# Patient Record
Sex: Male | Born: 1985 | Race: Black or African American | Hispanic: No | Marital: Married | Smoking: Never smoker
Health system: Southern US, Community
[De-identification: ages and names within clinical notes are randomized; demographics above are authoritative.]

## PROBLEM LIST (undated history)

## (undated) DIAGNOSIS — Z803 Family history of malignant neoplasm of breast: Secondary | ICD-10-CM

## (undated) DIAGNOSIS — Z8481 Family history of carrier of genetic disease: Secondary | ICD-10-CM

## (undated) HISTORY — DX: Family history of carrier of genetic disease: Z84.81

## (undated) HISTORY — DX: Family history of malignant neoplasm of breast: Z80.3

---

## 2012-04-19 ENCOUNTER — Emergency Department (HOSPITAL_COMMUNITY): Payer: BC Managed Care – PPO

## 2012-04-19 ENCOUNTER — Encounter (HOSPITAL_COMMUNITY): Payer: Self-pay | Admitting: Emergency Medicine

## 2012-04-19 ENCOUNTER — Emergency Department (HOSPITAL_COMMUNITY)
Admission: EM | Admit: 2012-04-19 | Discharge: 2012-04-19 | Disposition: A | Discharge status: Home / Self Care | Payer: BC Managed Care – PPO | Attending: Emergency Medicine | Admitting: Emergency Medicine

## 2012-04-19 DIAGNOSIS — R319 Hematuria, unspecified: Secondary | ICD-10-CM | POA: Insufficient documentation

## 2012-04-19 DIAGNOSIS — Z79899 Other long term (current) drug therapy: Secondary | ICD-10-CM | POA: Insufficient documentation

## 2012-04-19 LAB — URINALYSIS, ROUTINE W REFLEX MICROSCOPIC
Glucose, UA: NEGATIVE mg/dL
Ketones, ur: 15 mg/dL — AB
Protein, ur: 100 mg/dL — AB

## 2012-04-19 MED ORDER — IOHEXOL 300 MG/ML  SOLN
100.0000 mL | Freq: Once | INTRAMUSCULAR | Status: AC | PRN
  Administered 2012-04-19: 100 mL via INTRAVENOUS

## 2012-04-19 NOTE — ED Provider Notes (Signed)
Medical screening examination/treatment/procedure(s) were performed by non-physician practitioner and as supervising physician I was immediately available for consultation/collaboration.  John-Adam Pietrina Jagodzinski, M.D.     John-Adam Dalaya Suppa, MD 04/19/12 0726 

## 2012-04-19 NOTE — ED Notes (Signed)
Pt reports strike in Left side flank while playing basketball denies injury at that time but had bloody urine later in the day

## 2012-04-19 NOTE — ED Provider Notes (Signed)
History     CSN: [id number]  Arrival date & time 04/19/12  0111   First MD Initiated Contact with Patient 04/19/12 0139      Chief Complaint  Patient presents with  . Flank Pain    (Consider location/radiation/quality/duration/timing/severity/associated sxs/prior treatment) HPI History provided by pt.   An opponent bumped into patient's left side while playing basketball at approx 7:30pm today and when he returned home, he noticed bright red blood in his urine.  Has minimal pain in left flank.  No other recent urinary sx including fever, dysuria and increased frequency.  No prior h/o hematuria.  Is not anti-coagulated.   History reviewed. No pertinent past medical history.  History reviewed. No pertinent past surgical history.  History reviewed. No pertinent family history.  History  Substance Use Topics  . Smoking status: Never Smoker   . Smokeless tobacco: Not on file  . Alcohol Use: Yes      Review of Systems  All other systems reviewed and are negative.    Allergies  Naproxen  Home Medications   Current Outpatient Rx  Name Route Sig Dispense Refill  . ADULT MULTIVITAMIN W/MINERALS CH Oral Take 1 tablet by mouth daily.      BP 128/76  Temp 98.1 F (36.7 C) (Oral)  Resp 18  SpO2 98%  Physical Exam  Nursing note and vitals reviewed. Constitutional: He is oriented to person, place, and time. He appears well-developed and well-nourished. No distress.       Pt does not appear uncomfortable  HENT:  Head: Normocephalic and atraumatic.  Eyes:       Normal appearance  Neck: Normal range of motion.  Cardiovascular: Normal rate and regular rhythm.   Pulmonary/Chest: Effort normal and breath sounds normal. No respiratory distress.  Abdominal: Soft. Bowel sounds are normal. He exhibits no distension and no mass. There is no tenderness. There is no rebound and no guarding.  Genitourinary:       No CVA tenderness.  No ecchymosis of left flank.     Musculoskeletal: Normal range of motion.  Neurological: He is alert and oriented to person, place, and time.  Skin: Skin is warm and dry. No rash noted.  Psychiatric: He has a normal mood and affect. His behavior is normal.    ED Course  Procedures (including critical care time)  Labs Reviewed  URINALYSIS, ROUTINE W REFLEX MICROSCOPIC - Abnormal; Notable for the following:    Color, Urine RED (*)  BIOCHEMICALS MAY BE AFFECTED BY COLOR   APPearance TURBID (*)     Hgb urine dipstick LARGE (*)     Bilirubin Urine MODERATE (*)     Ketones, ur 15 (*)     Protein, ur 100 (*)     Leukocytes, UA MODERATE (*)     All other components within normal limits  URINE MICROSCOPIC-ADD ON   Ct Abdomen Pelvis W Contrast  04/19/2012  *RADIOLOGY REPORT*  Clinical Data: Left flank pain.  Hematuria.  No known injury.  CT ABDOMEN AND PELVIS WITH CONTRAST  Technique:  Multidetector CT imaging of the abdomen and pelvis was performed following the standard protocol during bolus administration of intravenous contrast.  Contrast: OMNIPAQUE IOHEXOL 300 MG/ML  SOLN  Comparison: None.  Findings: Lung bases are clear.  No pleural or pericardial effusion.  The kidneys are normal in appearance bilaterally.  The gallbladder, liver, spleen, adrenal glands and pancreas appear normal.  There is no lymphadenopathy or fluid.  The stomach, small and  large bowel and appendix all appear normal.  No bony abnormalities identified.  IMPRESSION: Normal examination.   Original Report Authenticated By: Bernadene Bell. D'ALESSIO, M.D.      1. Hematuria       MDM  25yo healthy M was hit in left side during basketball game this evening and now presents w/ hematuria.  Minimal L flank pain and no ecchymosis or tenderness of left flank on exam.  Gross hematuria.  U/A and trauma CT to evaluate L kidney ordered.  2:30 AM   U/A positive for RBCs but is not infected.  CT neg.  Results discussed w/ pt.  He is pain-free and hematuria is  reportedly clearing.  D/c'd home w/ urology referral and Strict return precautions discussed. 4:28 AM         Otilio Miu, PA 04/19/12 [phone]

## 2013-09-19 ENCOUNTER — Telehealth: Payer: Self-pay

## 2013-09-19 NOTE — Telephone Encounter (Signed)
Relevant patient education mailed to patient.  

## 2016-05-26 ENCOUNTER — Emergency Department (HOSPITAL_COMMUNITY)
Admission: EM | Admit: 2016-05-26 | Discharge: 2016-05-26 | Disposition: A | Discharge status: Home / Self Care | Payer: 59 | Attending: Emergency Medicine | Admitting: Emergency Medicine

## 2016-05-26 ENCOUNTER — Emergency Department (HOSPITAL_COMMUNITY): Payer: 59

## 2016-05-26 ENCOUNTER — Encounter (HOSPITAL_COMMUNITY): Payer: Self-pay | Admitting: Pharmacy Technician

## 2016-05-26 DIAGNOSIS — Y999 Unspecified external cause status: Secondary | ICD-10-CM | POA: Insufficient documentation

## 2016-05-26 DIAGNOSIS — S62646A Nondisplaced fracture of proximal phalanx of right little finger, initial encounter for closed fracture: Secondary | ICD-10-CM

## 2016-05-26 DIAGNOSIS — Y929 Unspecified place or not applicable: Secondary | ICD-10-CM | POA: Diagnosis not present

## 2016-05-26 DIAGNOSIS — W2105XA Struck by basketball, initial encounter: Secondary | ICD-10-CM | POA: Diagnosis not present

## 2016-05-26 DIAGNOSIS — S6991XA Unspecified injury of right wrist, hand and finger(s), initial encounter: Secondary | ICD-10-CM | POA: Diagnosis present

## 2016-05-26 DIAGNOSIS — Y9367 Activity, basketball: Secondary | ICD-10-CM | POA: Diagnosis not present

## 2016-05-26 NOTE — ED Notes (Signed)
Patient is A&Ox4 at this time.  Patient in no signs of distress.  Please see providers note for complete history and physical exam.  

## 2016-05-26 NOTE — ED Notes (Signed)
Patient Alert and oriented X4. Stable and ambulatory. Patient verbalized understanding of the discharge instructions.  Patient belongings were taken by the patient.  

## 2016-05-26 NOTE — Discharge Instructions (Signed)
I recommend taking Tylenol and/or ibuprofen as prescribed over-the-counter as needed for pain relief and to help with swelling. I recommend resting, elevating and applying ice to right hand for 15-20 minutes 3-4 times daily. Continue wearing buddy tape to right fingers for the next 1-2 weeks. Follow-up with the hand surgeon listed below for further management as needed. Please return to the Emergency Department if symptoms worsen or new onset of fever, redness, swelling, warmth, numbness, weakness, decreased range of motion.

## 2016-05-26 NOTE — ED Provider Notes (Signed)
[facility]-EMERGENCY DEPT Provider Note   CSN: [id number] Arrival date & time: 05/26/16  2130  By signing my name below, I, Majel HomerPeyton Lee, attest that this documentation has been prepared under the direction and in the presence of non-physician practitioner, Melburn HakeNicole Tanazia Achee, PA-C. Electronically Signed: Majel HomerPeyton Lee, Scribe. 05/26/2016. 11:51 PM.  History   Chief Complaint Chief Complaint  Patient presents with  . Finger Injury   The history is provided by the patient. No language interpreter was used.   HPI Comments: Aaron Barber is a 30 y.o. male who presents to the Emergency Department complaining of gradually worsening, right fifth digit pain s/p an injury that occurred this evening. Pt reports he was playing basketball at ~9:00 PM this evening when his hand was suddenly "hit" during the game. He states he looked down at his hand and noticed that his right fifth digit was "sideways." He reports he "moved it back in place" but is still experiencing a "throbbing" pain. He states he has not taken any medication for his pain. Pt denies numbness in any of his fingers.   History reviewed. No pertinent past medical history.  There are no active problems to display for this patient.  History reviewed. No pertinent surgical history.  Home Medications    Prior to Admission medications   Medication Sig Start Date End Date Taking? Authorizing Provider  Multiple Vitamin (MULTIVITAMIN WITH MINERALS) TABS Take 1 tablet by mouth daily.    Historical Provider, MD    Family History History reviewed. No pertinent family history.  Social History Social History  Substance Use Topics  . Smoking status: Never Smoker  . Smokeless tobacco: Never Used  . Alcohol use No     Allergies   Naproxen   Review of Systems Review of Systems  Musculoskeletal: Positive for arthralgias.  Neurological: Negative for numbness.   Physical Exam Updated Vital Signs BP 120/79 (BP Location: Left Arm)   Pulse 77    Temp 99.2 F (37.3 C) (Oral)   Resp 18   Ht 6\' 4"  (1.93 m)   Wt 193 lb 7 oz (87.7 kg)   SpO2 98%   BMI 23.55 kg/m   Physical Exam  Constitutional: He is oriented to person, place, and time. He appears well-developed and well-nourished.  HENT:  Head: Normocephalic and atraumatic.  Eyes: Conjunctivae and EOM are normal. Right eye exhibits no discharge. Left eye exhibits no discharge. No scleral icterus.  Cardiovascular: Normal rate and intact distal pulses.   Pulmonary/Chest: Effort normal.  Musculoskeletal:       Right hand: He exhibits tenderness and swelling. He exhibits normal range of motion, normal two-point discrimination, normal capillary refill, no deformity and no laceration. Normal sensation noted. Normal strength noted.       Hands: Mild swelling, ecchymosis, and tenderness to right fifth PIP joint. Full ROM with 5/5 strength to right fifth DIP, PIP, and MCP joint. Sensation grossly intact. Cap refill <2. 2+ radial pulse.   Neurological: He is alert and oriented to person, place, and time.  Nursing note and vitals reviewed.  ED Treatments / Results  Labs (all labs ordered are listed, but only abnormal results are displayed) Labs Reviewed - No data to display  EKG  EKG Interpretation None       Radiology Dg Finger Little Right  Result Date: 05/26/2016 CLINICAL DATA:  Fifth finger trauma tonight. EXAM: RIGHT LITTLE FINGER 2+V COMPARISON:  None. FINDINGS: There is a tiny fragment at the ulnar aspect of the fifth PIP,  probably an avulsion injury. No dislocation. IMPRESSION: Tiny avulsion at the ulnar aspect of the fifth PIP. Electronically Signed   By: Ellery Plunkaniel R Mitchell M.D.   On: 05/26/2016 22:46    Procedures Procedures (including critical care time)  Medications Ordered in ED Medications - No data to display  DIAGNOSTIC STUDIES:  Oxygen Saturation is 98% on RA, normal by my interpretation.    COORDINATION OF CARE:  11:37 PM Discussed treatment plan with  pt at bedside and pt agreed to plan.  Initial Impression / Assessment and Plan / ED Course  I have reviewed the triage vital signs and the nursing notes.  Pertinent labs & imaging results that were available during my care of the patient were reviewed by me and considered in my medical decision making (see chart for details).  Clinical Course     Patient X-Ray with tiny avulsion at ulnar aspect of 5th PIP. Right hand/finger neurovascularly intact. Buddy tape applied in the ED. Pt advised to follow up with orthopedics for further evaluation and treatment.  Conservative therapy recommended and discussed. Patient will be dc home & is agreeable with above plan. I have also discussed reasons to return immediately to the ER.  Patient expresses understanding and agrees with plan.    I personally performed the services described in this documentation, which was scribed in my presence. The recorded information has been reviewed and is accurate.   Final Clinical Impressions(s) / ED Diagnoses   Final diagnoses:  Closed nondisplaced fracture of proximal phalanx of right little finger, initial encounter    New Prescriptions Discharge Medication List as of 05/26/2016 11:43 PM       Barrett HenleNicole Elizabeth Guillaume Weninger, PA-C 05/27/16 0155    Dione Boozeavid Glick, MD 05/27/16 [phone]

## 2016-05-26 NOTE — ED Triage Notes (Signed)
Pt reports to the ED with complaints of hurting his finger playing basketball. Pt reports his hand was hit during the game and looked down and his 5th digit on the Right hand was sideways. He also states he moved it back into place but his finger feels numb at the joint. Pt reports his finger is throbbing but no pain.

## 2016-06-01 ENCOUNTER — Ambulatory Visit (INDEPENDENT_AMBULATORY_CARE_PROVIDER_SITE_OTHER): Admitting: Urgent Care

## 2016-06-01 ENCOUNTER — Ambulatory Visit (INDEPENDENT_AMBULATORY_CARE_PROVIDER_SITE_OTHER)

## 2016-06-01 VITALS — BP 120/78 | HR 61 | Temp 97.8°F | Resp 17 | Ht 76.0 in | Wt 199.0 lb

## 2016-06-01 DIAGNOSIS — S8992XA Unspecified injury of left lower leg, initial encounter: Secondary | ICD-10-CM

## 2016-06-01 DIAGNOSIS — M25562 Pain in left knee: Secondary | ICD-10-CM

## 2016-06-01 DIAGNOSIS — S8002XA Contusion of left knee, initial encounter: Secondary | ICD-10-CM | POA: Diagnosis not present

## 2016-06-01 DIAGNOSIS — Z026 Encounter for examination for insurance purposes: Secondary | ICD-10-CM

## 2016-06-01 MED ORDER — IBUPROFEN 600 MG PO TABS: 600.0000 mg | ORAL_TABLET | Freq: Three times a day (TID) | ORAL | 0 refills | Status: AC | PRN

## 2016-06-01 NOTE — Progress Notes (Signed)
    MRN: [id number] DOB: 08-30-1985  Subjective:   Aaron Barber is a 30 y.o. male presenting for chief complaint of Knee Injury (Left. Happened this morning at work.)  Reports suffering a left knee injury today while at work. 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 Pain is mostly achy with sharp intermittent pains. Has had difficulty bending knee, walking up steps. Has not tried any medications for his knee pain. He tried to continue to work but his knee pain is not resolving. Denies bruising, knee buckling, swelling, redness.  Aaron Barber has medications, allergies, pmh, psh that were updated as necessary and not included due to being a worker's compensation claim.  Objective:   Vitals: BP 120/78 (BP Location: Left Arm, Patient Position: Sitting, Cuff Size: Large)   Pulse 61   Temp 97.8 F (36.6 C) (Oral)   Resp 17   Ht 6\' 4"  (1.93 m)   Wt 199 lb (90.3 kg)   SpO2 99%   BMI 24.22 kg/m   Physical Exam  Constitutional: He is oriented to person, place, and time. He appears well-developed and well-nourished.  Cardiovascular: Normal rate.   Pulmonary/Chest: Effort normal.  Musculoskeletal:       Left knee: He exhibits normal range of motion, no swelling, no effusion, no ecchymosis, no deformity, no laceration, no erythema, normal alignment, no LCL laxity and normal patellar mobility. Tenderness (over quadriceps tendon, superior patella only while leg is in extension) found. No medial joint line, no lateral joint line, no MCL, no LCL and no patellar tendon tenderness noted.  Neurological: He is alert and oriented to person, place, and time.  Skin: Skin is warm and dry.   Dg Knee Complete 4 Views Left  Result Date: 06/01/2016 CLINICAL DATA:  Knee injury, initial visit, or pins compensation study. EXAM: LEFT KNEE - COMPLETE 4+ VIEW COMPARISON:  None in PACs FINDINGS: The bones are  subjectively adequately mineralized. There is no acute or healing fracture. There is no dislocation. There is mild beaking of the tibial spines. A small spur arises from the inferior articular margin of the patella. The joint spaces are well maintained. There is no joint effusion. IMPRESSION: There is no acute bony abnormality of the left knee. 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 Electronically Signed   By: David  SwazilandJordan M.D.   On: 06/01/2016 15:20   Assessment and Plan :   1. Encounter related to worker's compensation claim 2. Contusion of left knee, initial encounter 3. Injury, knee, left, initial encounter 4. Acute pain of left knee - X-rays were negative. Recommend conservative management. RTC in 1 week if no improvement.  Wallis BambergMario Dary Dilauro, PA-C [facility] [phone] 06/01/2016 2:54 PM

## 2016-06-01 NOTE — Patient Instructions (Addendum)
Take 600mg  every 8 hours with food as needed for moderate pain. You may also use 500mg  Tylenol every 6 hours alternating between this and ibuprofen as you need to.  Use a knee wrap while at work.    IF you received an x-ray today, you will receive an invoice from [facility]. Please contact [facility] at [phone] with questions or concerns regarding your invoice.   IF you received labwork today, you will receive an invoice from [facility]. Please contact [facility] at [phone] with questions or concerns regarding your invoice.   Our billing staff will not be able to assist you with questions regarding bills from these companies.  You will be contacted with the lab results as soon as they are available. The fastest way to get your results is to activate your My Chart account. Instructions are located on the last page of this paperwork. If you have not heard from us regarding the results in 2 weeks, please contact this office.

## 2016-06-09 ENCOUNTER — Ambulatory Visit (INDEPENDENT_AMBULATORY_CARE_PROVIDER_SITE_OTHER): Admitting: Physician Assistant

## 2016-06-09 VITALS — BP 118/78 | HR 72 | Temp 98.3°F | Resp 17 | Ht 76.0 in | Wt 197.0 lb

## 2016-06-09 DIAGNOSIS — S8002XD Contusion of left knee, subsequent encounter: Secondary | ICD-10-CM

## 2016-06-09 DIAGNOSIS — S8992XA Unspecified injury of left lower leg, initial encounter: Secondary | ICD-10-CM

## 2016-06-09 DIAGNOSIS — Z026 Encounter for examination for insurance purposes: Secondary | ICD-10-CM

## 2016-06-09 DIAGNOSIS — M25562 Pain in left knee: Secondary | ICD-10-CM | POA: Diagnosis not present

## 2016-06-09 DIAGNOSIS — S8002XA Contusion of left knee, initial encounter: Secondary | ICD-10-CM

## 2016-06-09 DIAGNOSIS — S8992XD Unspecified injury of left lower leg, subsequent encounter: Secondary | ICD-10-CM

## 2016-06-09 NOTE — Patient Instructions (Signed)
     IF you received an x-ray today, you will receive an invoice from [facility]. Please contact [facility] at [phone] with questions or concerns regarding your invoice.   IF you received labwork today, you will receive an invoice from [facility]. Please contact [facility] at [phone] with questions or concerns regarding your invoice.   Our billing staff will not be able to assist you with questions regarding bills from these companies.  You will be contacted with the lab results as soon as they are available. The fastest way to get your results is to activate your My Chart account. Instructions are located on the last page of this paperwork. If you have not heard from us regarding the results in 2 weeks, please contact this office.     

## 2016-06-09 NOTE — Progress Notes (Signed)
   06/09/2016 2:01 PM   DOB: [birth year] / MRN: [id number]  SUBJECTIVE:  Aaron Barber is a 30 y.o. male presenting for left knee pain. This began while working and previous HPI from 12/11 by PA Urban GibsonMani as follows:  Reports suffering a left knee injury today while at work. 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 Pain is mostly achy with sharp intermittent pains. Has had difficulty bending knee, walking up steps. Has not tried any medications for his knee pain. He tried to continue to work but his knee pain is not resolving. Denies bruising, knee buckling, swelling, redness.  Rads that day did reveal a spur about the tibial spines and patella. He was given Advil 600 mg at that time and today he reports feeling 100% better.       He is allergic to naproxen.   Review of Systems  Musculoskeletal: Negative for back pain, joint pain and myalgias.    The problem list and medications were reviewed and updated by myself where necessary and exist elsewhere in the encounter.   OBJECTIVE:  BP 118/78 (BP Location: Right Arm, Patient Position: Sitting, Cuff Size: Normal)   Pulse 72   Temp 98.3 F (36.8 C) (Oral)   Resp 17   Ht 6\' 4"  (1.93 m)   Wt 197 lb (89.4 kg)   SpO2 99%   BMI 23.98 kg/m   Physical Exam  Constitutional: He is oriented to person, place, and time.  Musculoskeletal: Normal range of motion. He exhibits no edema, tenderness or deformity.       Left knee: Normal. He exhibits normal range of motion and normal patellar mobility. No tenderness found.  Neurological: He is alert and oriented to person, place, and time.  Skin: Skin is warm and dry. No rash noted.  Vitals reviewed.   No results found for this or any previous visit (from the past 72 hour(s)).  No results found.  ASSESSMENT AND PLAN  Aaron KnucklesChristian was seen today for follow-up.  Diagnoses and all orders for  this visit:  Encounter related to worker's compensation claim: Injury has resolved with time. He is  Back to work with no restrictions.   Contusion of left knee, initial encounter  Injury, knee, left, initial encounter  Acute pain of left knee    The patient is advised to call or return to clinic if he does not see an improvement in symptoms, or to seek the care of the closest emergency department if he worsens with the above plan.   Deliah BostonMichael Recardo Linn, MHS, PA-C [facility] 06/09/2016 2:01 PM

## 2016-07-10 NOTE — Progress Notes (Signed)
Note reviewed, and agree with documentation and plan. Signed,   Meredith StaggersJeffrey Rossetta Kama, MD [facility].  07/10/16 5:02 PM

## 2016-10-31 ENCOUNTER — Ambulatory Visit (INDEPENDENT_AMBULATORY_CARE_PROVIDER_SITE_OTHER)

## 2016-10-31 ENCOUNTER — Ambulatory Visit (INDEPENDENT_AMBULATORY_CARE_PROVIDER_SITE_OTHER): Admitting: Emergency Medicine

## 2016-10-31 ENCOUNTER — Encounter: Payer: Self-pay | Admitting: Emergency Medicine

## 2016-10-31 VITALS — BP 120/72 | HR 56 | Temp 98.7°F | Resp 16 | Ht 76.0 in | Wt 183.2 lb

## 2016-10-31 DIAGNOSIS — M25561 Pain in right knee: Secondary | ICD-10-CM | POA: Diagnosis not present

## 2016-10-31 DIAGNOSIS — S8991XA Unspecified injury of right lower leg, initial encounter: Secondary | ICD-10-CM | POA: Diagnosis not present

## 2016-10-31 DIAGNOSIS — S8391XA Sprain of unspecified site of right knee, initial encounter: Secondary | ICD-10-CM | POA: Diagnosis not present

## 2016-10-31 MED ORDER — DICLOFENAC SODIUM 75 MG PO TBEC: 75.0000 mg | DELAYED_RELEASE_TABLET | Freq: Two times a day (BID) | ORAL | 0 refills | Status: AC

## 2016-10-31 NOTE — Progress Notes (Signed)
Aaron Barber 31 y.o.   Chief Complaint  Patient presents with  . Knee Pain    hurt on Thursday stepping up onto a step, pain around kneecap and behind it/ felt something pop    HISTORY OF PRESENT ILLNESS: This is a 31 y.o. male complaining of right knee injury sustained 2 days ago when he felt a pop followed by pain as he was stepping up a step while at work.  HPI   Prior to Admission medications   Medication Sig Start Date End Date Taking? Authorizing Provider  ibuprofen (ADVIL,MOTRIN) 600 MG tablet Take 1 tablet (600 mg total) by mouth every 8 (eight) hours as needed for moderate pain. Patient not taking: Reported on 10/31/2016 06/01/16   Wallis Bamberg, PA-C  Multiple Vitamin (MULTIVITAMIN WITH MINERALS) TABS Take 1 tablet by mouth daily.    [provider]    Allergies  Allergen Reactions  . Naproxen Nausea And Vomiting    There are no active problems to display for this patient.   History reviewed. No pertinent past medical history.  History reviewed. No pertinent surgical history.  Social History   Social History  . Marital status: Married    Spouse name: N/A  . Number of children: N/A  . Years of education: N/A   Occupational History  . Not on file.   Social History Main Topics  . Smoking status: Never Smoker  . Smokeless tobacco: Never Used  . Alcohol use No  . Drug use: No  . Sexual activity: Yes   Other Topics Concern  . Not on file   Social History Narrative  . No narrative on file    History reviewed. No pertinent family history.   Review of Systems  Constitutional: Negative for chills and fever.  Respiratory: Negative for shortness of breath.   Cardiovascular: Negative for chest pain.  Gastrointestinal: Negative for nausea and vomiting.  Musculoskeletal: Joint pain: right knee.  Skin: Negative for rash.  Neurological: Negative for sensory change and focal weakness.  Endo/Heme/Allergies: Does not bruise/bleed easily.  All  other systems reviewed and are negative.  Vitals:   10/31/16 1153  BP: 120/72  Pulse: (!) 56  Resp: 16  Temp: 98.7 F (37.1 C)     Physical Exam  Constitutional: He is oriented to person, place, and time. He appears well-developed and well-nourished.  HENT:  Head: Normocephalic and atraumatic.  Eyes: EOM are normal. Pupils are equal, round, and reactive to light.  Neck: Normal range of motion. Neck supple.  Cardiovascular: Normal rate and regular rhythm.   Pulmonary/Chest: Effort normal and breath sounds normal.  Musculoskeletal:  Right knee: painful ROM; no swelling or erythema; some posterior tenderness; rest of extremities WNL.  Neurological: He is alert and oriented to person, place, and time. No sensory deficit. He exhibits normal muscle tone.  Skin: Skin is warm and dry. Capillary refill takes less than 2 seconds. No rash noted.  Psychiatric: He has a normal mood and affect. His behavior is normal.  Vitals reviewed.  Xray reviewed: no bony abnormality  ASSESSMENT & PLAN: Aaron Barber was seen today for knee pain.  Diagnoses and all orders for this visit:  Acute pain of right knee -     DG Knee Complete 4 Views Right; Future -     Ambulatory referral to Orthopedic Surgery  Injury of right knee, initial encounter -     DG Knee Complete 4 Views Right; Future -     Ambulatory referral to Orthopedic  Surgery  Sprain of right knee, unspecified ligament, initial encounter -     Ambulatory referral to Orthopedic Surgery  Other orders -     diclofenac (VOLTAREN) 75 MG EC tablet; Take 1 tablet (75 mg total) by mouth 2 (two) times daily.    Patient Instructions       IF you received an x-ray today, you will receive an invoice from [facility]. Please contact [facility] at [phone] with questions or concerns regarding your invoice.   IF you received labwork today, you will receive an invoice from [facility]. Please contact [facility] at [phone]  with questions or concerns regarding your invoice.   Our billing staff will not be able to assist you with questions regarding bills from these companies.  You will be contacted with the lab results as soon as they are available. The fastest way to get your results is to activate your My Chart account. Instructions are located on the last page of this paperwork. If you have not heard from us regarding the results in 2 weeks, please contact this office.      Knee Sprain A knee sprain is a stretch or tear in a knee ligament. Knee ligaments are bands of tissue that connect bones in the knee to each other. Follow these instructions at home: If you have a splint or brace:   Wear the splint or brace as told by your doctor. Remove it only as told by your doctor.  Loosen the splint or brace if your toes tingle, get numb, or turn cold and blue.  Keep the splint or brace clean.  If the splint or brace is not waterproof:  Do not let it get wet.  Cover it with a watertight covering when you take a bath or a shower. If you have a cast:   Do not stick anything inside the cast to scratch your skin.  Check the skin around the cast every day. Tell your doctor about any concerns.  You may put lotion on dry skin around the edges of the cast. Do not put lotion on the skin underneath the cast.  Keep the cast clean.  If the cast is not waterproof:  Do not let it get wet.  Cover it with a watertight covering when you take a bath or a shower. Managing pain, stiffness, and swelling   Gently move your toes often to avoid stiffness and to lessen swelling.  Raise (elevate) the injured area above the level of your heart while you are sitting or lying down.  Take over-the-counter and prescription medicines only as told by your doctor.  If directed, put ice on the injured area.  If you have a removable splint or brace, remove it as told by your doctor.  Put ice in a plastic bag.  Place a towel  between your skin and the bag or between your cast and the bag.  Leave the ice on for 20 minutes, 2-3 times a day. General instructions   Do exercises as told by your doctor.  Keep all follow-up visits as told by your doctor. This is important. Contact a doctor if:  You have pain that gets worse.  The cast, brace, or splint does not fit right.  The cast, brace, or splint gets damaged. Get help right away if:  You cannot lean on your knee to stand or walk.  You cannot move the injured area.  You knee buckles or you have pain after you walk only a few  steps.  You have very bad pain, swelling, or numbness below the cast, brace, or splint. Summary  A knee sprain is a stretch or tear in a band (ligament) that connects your knee bones to each other.  You may need to wear a splint, brace, or cast to help your knee get better.  Contact your doctor if you have very bad pain, swelling, or numbness, or if you cannot walk. This information is not intended to replace advice given to you by your health care provider. Make sure you discuss any questions you have with your health care provider. Document Released: 05/27/2009 Document Revised: 02/25/2016 Document Reviewed: 02/25/2016 Elsevier Interactive Patient Education  2017 Elsevier Inc.     Edwina Barth, MD [facility]

## 2016-10-31 NOTE — Patient Instructions (Addendum)
   IF you received an x-ray today, you will receive an invoice from [facility]. Please contact [facility] at [phone] with questions or concerns regarding your invoice.   IF you received labwork today, you will receive an invoice from [facility]. Please contact [facility] at [phone] with questions or concerns regarding your invoice.   Our billing staff will not be able to assist you with questions regarding bills from these companies.  You will be contacted with the lab results as soon as they are available. The fastest way to get your results is to activate your My Chart account. Instructions are located on the last page of this paperwork. If you have not heard from us regarding the results in 2 weeks, please contact this office.     Knee Sprain A knee sprain is a stretch or tear in a knee ligament. Knee ligaments are bands of tissue that connect bones in the knee to each other. Follow these instructions at home: If you have a splint or brace:  Wear the splint or brace as told by your doctor. Remove it only as told by your doctor.  Loosen the splint or brace if your toes tingle, get numb, or turn cold and blue.  Keep the splint or brace clean.  If the splint or brace is not waterproof: ? Do not let it get wet. ? Cover it with a watertight covering when you take a bath or a shower. If you have a cast:  Do not stick anything inside the cast to scratch your skin.  Check the skin around the cast every day. Tell your doctor about any concerns.  You may put lotion on dry skin around the edges of the cast. Do not put lotion on the skin underneath the cast.  Keep the cast clean.  If the cast is not waterproof: ? Do not let it get wet. ? Cover it with a watertight covering when you take a bath or a shower. Managing pain, stiffness, and swelling  Gently move your toes often to avoid stiffness and to lessen swelling.  Raise (elevate) the injured area above  the level of your heart while you are sitting or lying down.  Take over-the-counter and prescription medicines only as told by your doctor.  If directed, put ice on the injured area. ? If you have a removable splint or brace, remove it as told by your doctor. ? Put ice in a plastic bag. ? Place a towel between your skin and the bag or between your cast and the bag. ? Leave the ice on for 20 minutes, 2-3 times a day. General instructions  Do exercises as told by your doctor.  Keep all follow-up visits as told by your doctor. This is important. Contact a doctor if:  You have pain that gets worse.  The cast, brace, or splint does not fit right.  The cast, brace, or splint gets damaged. Get help right away if:  You cannot lean on your knee to stand or walk.  You cannot move the injured area.  You knee buckles or you have pain after you walk only a few steps.  You have very bad pain, swelling, or numbness below the cast, brace, or splint. Summary  A knee sprain is a stretch or tear in a band (ligament) that connects your knee bones to each other.  You may need to wear a splint, brace, or cast to help your knee get better.  Contact your doctor if   you have very bad pain, swelling, or numbness, or if you cannot walk. This information is not intended to replace advice given to you by your health care provider. Make sure you discuss any questions you have with your health care provider. Document Released: 05/27/2009 Document Revised: 02/25/2016 Document Reviewed: 02/25/2016 Elsevier Interactive Patient Education  2017 Elsevier Inc.  

## 2016-11-02 ENCOUNTER — Telehealth: Payer: Self-pay

## 2016-11-02 NOTE — Telephone Encounter (Signed)
Needs peliminary work form filled out to best of ability to turn in to work until seen by specialist and he will fill out additional In your box

## 2016-11-03 NOTE — Telephone Encounter (Signed)
Patient has called 3 times and also came into the clinic tonight to see about his Workers Comp form that need to be filled out ASAP and faxed to his employer so he doesn't lose his job. I told him they were in the Dr's box and just waiting to be finished.   Can we see about getting these finished as soon as we can. Thank you

## 2016-11-04 NOTE — Telephone Encounter (Signed)
Will work on it today

## 2016-11-04 NOTE — Telephone Encounter (Signed)
Pt aware paperwork is ready for pick up

## 2016-11-09 ENCOUNTER — Telehealth: Payer: Self-pay | Admitting: Emergency Medicine

## 2016-11-09 NOTE — Telephone Encounter (Signed)
Needs to see Ortho first.

## 2016-11-09 NOTE — Telephone Encounter (Signed)
Routed to Dr. Alvy BimlerSagardia who saw patient on May 12.

## 2016-11-09 NOTE — Telephone Encounter (Signed)
Pt advised.

## 2016-11-09 NOTE — Telephone Encounter (Signed)
Please advise.  Ortho refer in

## 2016-11-09 NOTE — Telephone Encounter (Signed)
Pt calling in regards to MRI for right knee. Pt said he was supposed to have one but I do not see an order for one. Pt does have referral for Ortho. Were we going to let him be seen there before ordering an MRI? Please advise. Pt callback number is [phone].

## 2018-05-17 ENCOUNTER — Emergency Department (HOSPITAL_COMMUNITY)
Admission: EM | Admit: 2018-05-17 | Discharge: 2018-05-18 | Disposition: A | Discharge status: Home / Self Care | Payer: 59 | Attending: Emergency Medicine | Admitting: Emergency Medicine

## 2018-05-17 DIAGNOSIS — R569 Unspecified convulsions: Secondary | ICD-10-CM | POA: Diagnosis present

## 2018-05-17 LAB — CBC
HEMATOCRIT: 42.9 % (ref 39.0–52.0)
Hemoglobin: 13.7 g/dL (ref 13.0–17.0)
MCH: 27.9 pg (ref 26.0–34.0)
MCHC: 31.9 g/dL (ref 30.0–36.0)
MCV: 87.4 fL (ref 80.0–100.0)
PLATELETS: 214 10*3/uL (ref 150–400)
RBC: 4.91 MIL/uL (ref 4.22–5.81)
RDW: 11.5 % (ref 11.5–15.5)
WBC: 3.9 10*3/uL — ABNORMAL LOW (ref 4.0–10.5)
nRBC: 0 % (ref 0.0–0.2)

## 2018-05-17 LAB — BASIC METABOLIC PANEL
ANION GAP: 9 (ref 5–15)
BUN: 6 mg/dL (ref 6–20)
CALCIUM: 9.2 mg/dL (ref 8.9–10.3)
CO2: 28 mmol/L (ref 22–32)
Chloride: 99 mmol/L (ref 98–111)
Creatinine, Ser: 1.21 mg/dL (ref 0.61–1.24)
GFR calc Af Amer: >60 mL/min (ref >60)
GFR calc non Af Amer: >60 mL/min (ref >60)
GLUCOSE: 112 mg/dL — AB (ref 70–99)
Potassium: 3.7 mmol/L (ref 3.5–5.1)
SODIUM: 136 mmol/L (ref 135–145)

## 2018-05-17 LAB — CBG MONITORING, ED: GLUCOSE-CAPILLARY: 104 mg/dL — AB (ref 70–99)

## 2018-05-17 NOTE — ED Triage Notes (Signed)
Pt went to his doctor today and was diagnosed with the flu. Tonight was talking to his wife and she states he said he felt light headed sitting down then leaned backwards, hit his head on a door and began having a seizure. No hx. Pt in fetal position and diaphoretic, came out of seizure with no intervention and then was diaphoretic. Wife sts was post-ictal afterwards. Pt had headache that is now resolved. Feels back to normal now. Ambulatory to triage.

## 2018-05-18 ENCOUNTER — Emergency Department (HOSPITAL_COMMUNITY): Payer: 59

## 2018-05-18 NOTE — Discharge Instructions (Signed)
Can continue your tamiflu, tylenol or motrin for fever. Follow-up with neurology-- they should be contacting you regarding an appt.  If you do not hear from them in a reasonable time frame, call and follow-up to ensure you get an appt scheduled. No driving until cleared by neurology to do so. Return to the ED for new or worsening symptoms.

## 2018-05-18 NOTE — ED Provider Notes (Signed)
[facility] EMERGENCY DEPARTMENT Provider Note   CSN: [id number] Arrival date & time: 05/17/18  2048     History   Chief Complaint Chief Complaint  Patient presents with  . Influenza  . Seizures    HPI Aaron Barber is a 32 y.o. male.  The history is provided by the patient and medical records.  Influenza  Seizures       32 y.o. M with no significant PMH presenting to the ED for seizure.  Patient reports he started feeling unwell yesterday, seen by PCP today and diagnosed with influenza A, started on tamiflu.  This afternoon he was at home with his wife and started complaining that he did not feel well-- mostly lightheaded and flushed.  He sat down at the kitchen table, wife states he kind of went "out of it", started shaking his head and mumbling, lips smacked and eyes rolled back in his head.  States he was able to get to him and tried to lower him to the floor but he struck the back of his head against the pantry door.  States he had some twitching of his left arm and she rolled him on his side and soon after symptoms stopped.  States in total this was approx 2 mins or less.  It sounds like he was post-ictal for some time but feels back to baseline at this time.  No personal or family history of seizure disorder.  He is not currently on any medications besides tamiflu he started today.  He does report mild headache, mostly along the back of his head at area of impact.  Denies any current dizziness, confusion, numbness, weakness, blurred vision, trouble walking, neck pain or stiffness.    No past medical history on file.  There are no active problems to display for this patient.   No past surgical history on file.      Home Medications    Prior to Admission medications   Medication Sig Start Date End Date Taking? Authorizing Provider  ibuprofen (ADVIL,MOTRIN) 600 MG tablet Take 1 tablet (600 mg total) by mouth every 8 (eight) hours as needed for  moderate pain. Patient not taking: Reported on 10/31/2016 06/01/16   Wallis Bamberg, PA-C  Multiple Vitamin (MULTIVITAMIN WITH MINERALS) TABS Take 1 tablet by mouth daily.    [provider]    Family History No family history on file.  Social History Social History   Tobacco Use  . Smoking status: Never Smoker  . Smokeless tobacco: Never Used  Substance Use Topics  . Alcohol use: No  . Drug use: No     Allergies   Naproxen   Review of Systems Review of Systems  Neurological: Positive for seizures.  All other systems reviewed and are negative.    Physical Exam Updated Vital Signs BP 123/71 (BP Location: Right Arm)   Pulse 73   Temp 99.9 F (37.7 C) (Oral)   Resp (!) 22   SpO2 99%   Physical Exam  Constitutional: He is oriented to person, place, and time. He appears well-developed and well-nourished. No distress.  HENT:  Head: Normocephalic and atraumatic.  Right Ear: External ear normal.  Left Ear: External ear normal.  Nose: Mucosal edema and rhinorrhea (Clear) present.  Mouth/Throat: Oropharynx is clear and moist.  Small hematoma to right occipital/parietal scalp; this area is locally tender  Eyes: Pupils are equal, round, and reactive to light. Conjunctivae and EOM are normal.  Neck: Normal range of motion  and full passive range of motion without pain. Neck supple. No neck rigidity.  No rigidity, no meningismus No cervical spinal tenderness, no step-off or deformity  Cardiovascular: Normal rate, regular rhythm and normal heart sounds.  No murmur heard. Pulmonary/Chest: Effort normal and breath sounds normal. No stridor. No respiratory distress. He has no wheezes. He has no rhonchi.  Abdominal: Soft. Bowel sounds are normal. There is no tenderness. There is no rebound and no guarding.  Musculoskeletal: Normal range of motion. He exhibits no edema.  Neurological: He is alert and oriented to person, place, and time. He has normal strength. He displays no  tremor. No cranial nerve deficit or sensory deficit. He displays no seizure activity.  AAOx3, answering questions and following commands appropriately; equal strength UE and LE bilaterally; CN grossly intact; moves all extremities appropriately without ataxia; no focal neuro deficits or facial asymmetry appreciated, ambulatory with steady gait  Skin: Skin is warm and dry. No rash noted. He is not diaphoretic.  Psychiatric: He has a normal mood and affect. His behavior is normal. Thought content normal.  Nursing note and vitals reviewed.    ED Treatments / Results  Labs (all labs ordered are listed, but only abnormal results are displayed) Labs Reviewed  BASIC METABOLIC PANEL - Abnormal; Notable for the following components:      Result Value   Glucose, Bld 112 (*)    All other components within normal limits  CBC - Abnormal; Notable for the following components:   WBC 3.9 (*)    All other components within normal limits  CBG MONITORING, ED - Abnormal; Notable for the following components:   Glucose-Capillary 104 (*)    All other components within normal limits    EKG EKG Interpretation  Date/Time:  Tuesday May 17 2018 23:02:26 EST Ventricular Rate:  72 PR Interval:    QRS Duration: 77 QT Interval:  354 QTC Calculation: 388 R Axis:   71 Text Interpretation:  Sinus rhythm ST elev, probable normal early repol pattern No old tracing to compare Confirmed by Dione BoozeGlick, David ([id number]) on 05/18/2018 12:26:08 AM   Radiology Ct Head Wo Contrast  Result Date: 05/18/2018 CLINICAL DATA:  Seizure, new, nontraumatic, 18-40 yrs new onset seizure EXAM: CT HEAD WITHOUT CONTRAST TECHNIQUE: Contiguous axial images were obtained from the base of the skull through the vertex without intravenous contrast. COMPARISON:  None. FINDINGS: Brain: No intracranial hemorrhage, mass effect, or midline shift. No hydrocephalus. The basilar cisterns are patent. No evidence of territorial infarct or acute  ischemia. No extra-axial or intracranial fluid collection. Vascular: No hyperdense vessel or unexpected calcification. Skull: Normal. Negative for fracture or focal lesion. Sinuses/Orbits: Paranasal sinuses and mastoid air cells are clear. The visualized orbits are unremarkable. Other: None. IMPRESSION: Negative head CT. Electronically Signed   By: Narda RutherfordMelanie  Sanford M.D.   On: 05/18/2018 01:06    Procedures Procedures (including critical care time)  Medications Ordered in ED Medications - No data to display   Initial Impression / Assessment and Plan / ED Course  I have reviewed the triage vital signs and the nursing notes.  Pertinent labs & imaging results that were available during my care of the patient were reviewed by me and considered in my medical decision making (see chart for details).  32 year old male here with seizure-like activity.  No history of seizure in the past.  Was seen by PCP today and diagnosed with influenza A, started on Tamiflu.  He has low-grade fever here but is overall  nontoxic in appearance.  He is awake, alert, appropriately oriented.  Neurologic exam is nonfocal.  Does have a little bit of nasal congestion and rhinorrhea but lungs are clear no wheezes or rhonchi.  Screening labs were sent and are overall reassuring.  EKG is nonischemic.  During seizure activity he did fall back and strike his head against the pantry, given this as well as no prior history of seizures, screening head CT was obtained which is negative.  Patient has remained at his neurologic baseline here without any focal deficits.  Fever is likely related to influenza A, he does not have any other signs or symptoms suggestive of meningitis and I do not feel he needs an LP.  I recommended that he follow-up closely with neurology as an outpatient for ongoing evaluation/management.  We discussed driving precautions until evaluated and cleared by neurology.  Ambulatory referral was placed for him.  He will  continue Tamiflu as well as Tylenol/Motrin for flulike symptoms.  He will return here for any new or worsening symptoms.  Final Clinical Impressions(s) / ED Diagnoses   Final diagnoses:  Seizure-like activity [facility])    ED Discharge Orders         Ordered    Ambulatory referral to Neurology    Comments:  An appointment is requested in approximately: ASAP, new onset seizure   05/18/18 0129           Garlon Hatchet, PA-C 05/18/18 0138    Dione Booze, MD 05/18/18 [phone]

## 2018-05-23 ENCOUNTER — Telehealth: Payer: Self-pay | Admitting: Neurology

## 2018-05-23 ENCOUNTER — Encounter: Payer: Self-pay | Admitting: Neurology

## 2018-05-23 ENCOUNTER — Ambulatory Visit (INDEPENDENT_AMBULATORY_CARE_PROVIDER_SITE_OTHER): Payer: 59 | Admitting: Neurology

## 2018-05-23 VITALS — BP 119/70 | HR 52 | Ht 76.0 in | Wt 185.4 lb

## 2018-05-23 DIAGNOSIS — R55 Syncope and collapse: Secondary | ICD-10-CM | POA: Diagnosis not present

## 2018-05-23 DIAGNOSIS — R569 Unspecified convulsions: Secondary | ICD-10-CM | POA: Diagnosis not present

## 2018-05-23 NOTE — Telephone Encounter (Signed)
[employer] order sent to [facility]. They obtain the auth and will reach out to the pt to schedule.  °

## 2018-05-23 NOTE — Patient Instructions (Signed)
I had a long discussion with the patient and his father as well as spoke to his wife over the phone about his episode of brief loss of consciousness and jerking being possibly convulsive syncope versus a symptomatic seizure in the setting of febrile influenza illness.  I recommend further evaluation by checking EEG and MRI scan of the brain with and without contrast.  I do not believe anticonvulsant therapy is indicated at the present time as this event was likely a one-time event.  I advised him to stay away from seizure provoking events like sleep deprivation, extremes of physical activity and stimulants like alcohol and street drugs.  He was advised not to drive as per [facility] [place].  He may return back to work tomorrow without restrictions.  He will return for follow-up with me in 2 months or call earlier if necessary   Syncope Syncope is when you lose temporarily pass out (faint). Signs that you may be about to pass out include:  Feeling dizzy or light-headed.  Feeling sick to your stomach (nauseous).  Seeing all white or all black.  Having cold, clammy skin.  If you passed out, get help right away. Call your local emergency services (911 in the [place] Do not drive yourself to the hospital. Follow these instructions at home: Pay attention to any changes in your symptoms. Take these actions to help with your condition:  Have someone stay with you until you feel stable.  Do not drive, use machinery, or play sports until your doctor says it is okay.  Keep all follow-up visits as told by your doctor. This is important.  If you start to feel like you might pass out, lie down right away and raise (elevate) your feet above the level of your heart. Breathe deeply and steadily. Wait until all of the symptoms are gone.  Drink enough fluid to keep your pee (urine) clear or pale yellow.  If you are taking blood pressure or heart medicine, get up slowly and spend many minutes getting ready  to sit and then stand. This can help with dizziness.  Take over-the-counter and prescription medicines only as told by your doctor.  Get help right away if:  You have a very bad headache.  You have unusual pain in your chest, tummy, or back.  You are bleeding from your mouth or rectum.  You have black or tarry poop (stool).  You have a very fast or uneven heartbeat (palpitations).  It hurts to breathe.  You pass out once or more than once.  You have jerky movements that you cannot control (seizure).  You are confused.  You have trouble walking.  You are very weak.  You have vision problems. These symptoms may be an emergency. Do not wait to see if the symptoms will go away. Get medical help right away. Call your local emergency services (911 in the [place] Do not drive yourself to the hospital. This information is not intended to replace advice given to you by your health care provider. Make sure you discuss any questions you have with your health care provider. Document Released: 11/25/2007 Document Revised: 11/14/2015 Document Reviewed: 02/20/2015 Elsevier Interactive Patient Education  2018 [employer]  Seizure, Adult When you have a seizure:  Parts of your body may move.  How aware or awake (conscious) you are may change.  You may shake (convulse).  Some people have symptoms right before a seizure happens. These symptoms may include:  Fear.  Worry (anxiety).  Feeling like you are going to throw up (nausea).  Feeling like the room is spinning (vertigo).  Feeling like you saw or heard something before (deja vu).  Odd tastes or smells.  Changes in vision, such as seeing flashing lights or spots.  Seizures usually last from 30 seconds to 2 minutes. Usually, they are not harmful unless they last a long time. Follow these instructions at home: Medicines  Take over-the-counter and prescription medicines only as told by your doctor.  Avoid anything that  may keep your medicine from working, such as alcohol. Activity  Do not do any activities that would be dangerous if you had another seizure, like driving or swimming. Wait until your doctor approves.  If you live in the [place] ask your local DMV (department of motor vehicles) when you can drive.  Rest. Teaching others  Teach friends and family what to do when you have a seizure. They should: ? Lay you on the ground. ? Protect your head and body. ? Loosen any tight clothing around your neck. ? Turn you on your side. ? Stay with you until you are better. ? Not hold you down. ? Not put anything in your mouth. ? Know whether or not you need emergency care. General instructions  Contact your doctor each time you have a seizure.  Avoid anything that gives you seizures.  Keep a seizure diary. Write down: ? What you think caused each seizure. ? What you remember about each seizure.  Keep all follow-up visits as told by your doctor. This is important. Contact a doctor if:  You have another seizure.  You have seizures more often.  There is any change in what happens during your seizures.  You continue to have seizures with treatment.  You have symptoms of being sick or having an infection. Get help right away if:  You have a seizure: ? That lasts longer than 5 minutes. ? That is different than seizures you had before. ? That makes it harder to breathe. ? After you hurt your head.  After a seizure, you cannot speak or use a part of your body.  After a seizure, you are confused or have a bad headache.  You have two or more seizures in a row.  You are having seizures more often.  You do not wake up right after a seizure.  You get hurt during a seizure. In an emergency:  These symptoms may be an emergency. Do not wait to see if the symptoms will go away. Get medical help right away. Call your local emergency services (911 in the [place] Do not drive yourself to the  hospital. This information is not intended to replace advice given to you by your health care provider. Make sure you discuss any questions you have with your health care provider. Document Released: 11/25/2007 Document Revised: 02/19/2016 Document Reviewed: 02/19/2016 Elsevier Interactive Patient Education  2017 [employer].

## 2018-05-23 NOTE — Progress Notes (Signed)
[facility] [address] [place] [place] [address] [phone]       OFFICE CONSULT NOTE  Mr. Daisy Mcneel Date of Birth:  [birth year] Medical Record Number:  [id number]   Referring MD:  Sharilyn Sites, PA-C   Reason for Referral: syncope versus seizure HPI: Mr. Koeller is a pleasant 32 year old African-American male seen today for initial consultation visit.  He is accompanied by his father.  History is obtained from them and review of electronic medical records.  I personally reviewed imaging films.  Mr. Twyford states that he was suffering from flulike illness for 3 days.  He was at home and his wife was eyewitness whom I talked to over the phone stated that patient complained of feeling dizzy and had a headache.  As he sat down his eyes rolled up and left upper extremity went up.  He lost consciousness for only a brief.  Less than a minute.  He was sweating a lot.  Patient appeared to be disoriented when he woke up not knowing how he had fallen down.  He hit his head on the way down.  There was no weakness tonic-clonic activity but only brief jerking of his extremities.  There is no tongue bite or incontinence.  The patient had taken only 1 dose of Tamiflu.  The patient states his felt fine since then.  Patient had seen the primary care physician and had a nasal swab which was positive for influenza A.  He has finished a course of Tamiflu.  He denies any significant headaches, neck stiffness, blurred vision, nausea, double vision gait or balance problems.  He has no prior history of syncope or seizures.  There is no history of significant head injury with loss of consciousness.  He works in the [employer].  He denies doing drugs or smoking.  There is no family history of seizures.  He has no significant past medical history does not take any medications  ROS:   14 system review of systems is positive for loss of consciousness, choking, fever, headache and all other systems  negative  PMH: History reviewed. No pertinent past medical history.  Social History:  Social History   Socioeconomic History  . Marital status: Married    Spouse name: Not on file  . Number of children: Not on file  . Years of education: Not on file  . Highest education level: Not on file  Occupational History  . Not on file  Social Needs  . Financial resource strain: Not on file  . Food insecurity:    Worry: Not on file    Inability: Not on file  . Transportation needs:    Medical: Not on file    Non-medical: Not on file  Tobacco Use  . Smoking status: Never Smoker  . Smokeless tobacco: Never Used  Substance and Sexual Activity  . Alcohol use: No  . Drug use: No  . Sexual activity: Yes  Lifestyle  . Physical activity:    Days per week: Not on file    Minutes per session: Not on file  . Stress: Not on file  Relationships  . Social connections:    Talks on phone: Not on file    Gets together: Not on file    Attends religious service: Not on file    Active member of club or organization: Not on file    Attends meetings of clubs or organizations: Not on file    Relationship status: Not on file  .  Intimate partner violence:    Fear of current or ex partner: Not on file    Emotionally abused: Not on file    Physically abused: Not on file    Forced sexual activity: Not on file  Other Topics Concern  . Not on file  Social History Narrative  . Not on file    Medications:   Current Outpatient Medications on File Prior to Visit  Medication Sig Dispense Refill  . ibuprofen (ADVIL,MOTRIN) 600 MG tablet Take 1 tablet (600 mg total) by mouth every 8 (eight) hours as needed for moderate pain. 30 tablet 0  . Multiple Vitamin (MULTIVITAMIN WITH MINERALS) TABS Take 1 tablet by mouth daily.     No current facility-administered medications on file prior to visit.     Allergies:   Allergies  Allergen Reactions  . Naproxen Nausea And Vomiting    Physical Exam General:  well developed, well nourished young African-American male, seated, in no evident distress Head: head normocephalic and atraumatic.   Neck: supple with no carotid or supraclavicular bruits Cardiovascular: regular rate and rhythm, no murmurs Musculoskeletal: no deformity Skin:  no rash/petichiae Vascular:  Normal pulses all extremities  Neurologic Exam Mental Status: Awake and fully alert. Oriented to place and time. Recent and remote memory intact. Attention span, concentration and fund of knowledge appropriate. Mood and affect appropriate.  Cranial Nerves: Fundoscopic exam reveals sharp disc margins. Pupils equal, briskly reactive to light. Extraocular movements full without nystagmus. Visual fields full to confrontation. Hearing intact. Facial sensation intact. Face, tongue, palate moves normally and symmetrically.  Motor: Normal bulk and tone. Normal strength in all tested extremity muscles. Sensory.: intact to touch , pinprick , position and vibratory sensation.  Coordination: Rapid alternating movements normal in all extremities. Finger-to-nose and heel-to-shin performed accurately bilaterally. Gait and Station: Arises from chair without difficulty. Stance is normal. Gait demonstrates normal stride length and balance . Able to heel, toe and tandem walk without difficulty.  Reflexes: 1+ and symmetric. Toes downgoing.       ASSESSMENT: 32 year old male with solitary episode of brief loss of consciousness with jerking possible convulsive syncope versus seizure in the setting of a influenzal viral illness in November 2019.     PLAN: I had a long discussion with the patient and his father as well as spoke to his wife over the phone about his episode of brief loss of consciousness and jerking being possibly convulsive syncope versus a symptomatic seizure in the setting of febrile influenza illness.  I recommend further evaluation by checking EEG and MRI scan of the brain with and without  contrast.  I do not believe anticonvulsant therapy is indicated at the present time as this event was likely a one-time event.  I advised him to stay away from seizure provoking events like sleep deprivation, extremes of physical activity and stimulants like alcohol and street drugs.  He was advised not to drive as per [facility] [place].  He may return back to work tomorrow without restrictions.  Greater than 50% time during this 45-minute consultation visit was spent on counseling and coordination of care about his episode of convulsive syncope versus seizure and answering questions he will return for follow-up with me in 2 months or call earlier if necessary Delia HeadyPramod Sethi, MD  [facility] [address] [place], [place] [id number]  Phone [phone] Fax [phone] Note: This document was prepared with digital dictation and possible smart phrase technology. Any transcriptional errors that result from  this process are unintentional.

## 2018-05-28 ENCOUNTER — Ambulatory Visit
Admission: RE | Admit: 2018-05-28 | Discharge: 2018-05-28 | Disposition: A | Discharge status: Home / Self Care | Payer: 59 | Source: Ambulatory Visit | Attending: Neurology | Admitting: Neurology

## 2018-05-28 DIAGNOSIS — R569 Unspecified convulsions: Secondary | ICD-10-CM | POA: Diagnosis not present

## 2018-05-28 MED ORDER — GADOBENATE DIMEGLUMINE 529 MG/ML IV SOLN
17.0000 mL | Freq: Once | INTRAVENOUS | Status: AC | PRN
  Administered 2018-05-28: 17 mL via INTRAVENOUS

## 2018-05-30 NOTE — Telephone Encounter (Signed)
[employer] auth: [id number] (exp. 05/27/18 to 08/25/18) pt had it at GI on 05/28/18.

## 2018-06-08 ENCOUNTER — Telehealth: Payer: Self-pay

## 2018-06-08 NOTE — Telephone Encounter (Signed)
I called pt to discuss his MRI results. No answer, left a message asking him to call me back. 

## 2018-06-08 NOTE — Telephone Encounter (Signed)
Pt returned my call. I advised him that Dr. Pearlean BrownieSethi reported that his MRI of his head was unremarkable. I reminded pt of his EEG appt. Pt verbalized understanding of results.

## 2018-06-08 NOTE — Telephone Encounter (Signed)
-----   Message from Micki RileyPramod S Sethi, MD sent at 06/03/2018  6:43 PM EST ----- Kindly inform the patient that MRI scan of the head was unremarkable

## 2018-06-29 ENCOUNTER — Ambulatory Visit (INDEPENDENT_AMBULATORY_CARE_PROVIDER_SITE_OTHER): Payer: 59

## 2018-06-29 DIAGNOSIS — R55 Syncope and collapse: Secondary | ICD-10-CM | POA: Diagnosis not present

## 2018-06-29 DIAGNOSIS — R569 Unspecified convulsions: Secondary | ICD-10-CM

## 2018-07-18 ENCOUNTER — Telehealth: Payer: Self-pay | Admitting: Neurology

## 2018-07-18 NOTE — Telephone Encounter (Signed)
Pt states he would like a call with his EEG results. Please advise.

## 2018-07-18 NOTE — Telephone Encounter (Signed)
Spoke with the patient and he verbalized understanding his results. No other question or concerns at this time.

## 2018-08-02 ENCOUNTER — Other Ambulatory Visit: Payer: Self-pay

## 2018-08-02 ENCOUNTER — Encounter: Payer: Self-pay | Admitting: Neurology

## 2018-08-02 ENCOUNTER — Ambulatory Visit (INDEPENDENT_AMBULATORY_CARE_PROVIDER_SITE_OTHER): Payer: 59 | Admitting: Neurology

## 2018-08-02 VITALS — BP 129/71 | HR 54 | Resp 16 | Ht 76.0 in | Wt 183.0 lb

## 2018-08-02 DIAGNOSIS — R55 Syncope and collapse: Secondary | ICD-10-CM | POA: Diagnosis not present

## 2018-08-02 DIAGNOSIS — R569 Unspecified convulsions: Secondary | ICD-10-CM

## 2018-08-02 NOTE — Patient Instructions (Signed)
I had a long discussion with the patient and I also spoke to his wife over the phone about his episode of brief loss of consciousness which likely represents convulsive syncope in the setting of flulike illness due to dehydration rather than a tonic-clonic seizure.  He has normal brain imaging study and EEG.  I recommend adequate hydration.  He was advised not to drive for the next 3 months as per [facility].  He will return for follow-up in the future with my nurse practitioner Shanda BumpsJessica or call earlier if necessary  Syncope Syncope is when you pass out (faint) for a short time. It is caused by a sudden decrease in blood flow to the brain. Signs that you may be about to pass out include:  Feeling dizzy or light-headed.  Feeling sick to your stomach (nauseous).  Seeing all white or all black.  Having cold, clammy skin. If you pass out, get help right away. Call your local emergency services (911 in the [place] Do not drive yourself to the hospital. Follow these instructions at home: Watch for any changes in your symptoms. Take these actions to stay safe and help with your symptoms: Lifestyle  Do not drive, use machinery, or play sports until your doctor says it is okay.  Do not drink alcohol.  Do not use any products that contain nicotine or tobacco, such as cigarettes and e-cigarettes. If you need help quitting, ask your doctor.  Drink enough fluid to keep your pee (urine) pale yellow. General instructions  Take over-the-counter and prescription medicines only as told by your doctor.  If you are taking blood pressure or heart medicine, sit up and stand up slowly. Spend a few minutes getting ready to sit and then stand. This can help you feel less dizzy.  Have someone stay with you until you feel stable.  If you start to feel like you might pass out, lie down right away and raise (elevate) your feet above the level of your heart. Breathe deeply and steadily. Wait until all of the  symptoms are gone.  Keep all follow-up visits as told by your doctor. This is important. Get help right away if:  You have a very bad headache.  You pass out once or more than once.  You have pain in your chest, belly, or back.  You have a very fast or uneven heartbeat (palpitations).  It hurts to breathe.  You are bleeding from your mouth or your bottom (rectum).  You have black or tarry poop (stool).  You have jerky movements that you cannot control (seizure).  You are confused.  You have trouble walking.  You are very weak.  You have vision problems. These symptoms may be an emergency. Do not wait to see if the symptoms will go away. Get medical help right away. Call your local emergency services (911 in the [place] Do not drive yourself to the hospital. Summary  Syncope is when you pass out (faint) for a short time. It is caused by a sudden decrease in blood flow to the brain.  Signs that you may be about to faint include feeling dizzy, light-headed, or sick to your stomach, seeing all white or all black, or having cold, clammy skin.  If you start to feel like you might pass out, lie down right away and raise (elevate) your feet above the level of your heart. Breathe deeply and steadily. Wait until all of the symptoms are gone. This information is not intended to  replace advice given to you by your health care provider. Make sure you discuss any questions you have with your health care provider. Document Released: 11/25/2007 Document Revised: 07/21/2017 Document Reviewed: 07/21/2017 Elsevier Interactive Patient Education  2019 [employer].

## 2018-08-02 NOTE — Progress Notes (Signed)
[facility] [address] [place]. [place] [address] [phone]       OFFICE CONSULT NOTE  [name] Date of Birth:  [birth year] Medical Record Number:  [id number]   Referring MD: Aaron Sites, PA-C  Reason for Referral: Seizure HPI: [name] is a 33 year old African-American male seen today for initial office consultation visit.  History is obtained from the patient and I spoke to his wife over the phone.  I have reviewed electronic medical records and imaging films in PACS personally.  The patient states that he was suffering from a febrile flulike illness in November 2019 and tested positive for influenza A. and was taking Tamiflu he was sent home on 05/17/2018 when he tried to get up from a chair and felt lightheaded.  He was able to sit down in the chair but his wife was eyewitness described the patient speech getting mumbled and soft and eyes rolling back and he blacked out.  He hit the door and the floor.  He was unresponsive briefly for less than a minute.  She noticed that his arms were raised up but she denied tonic-clonic activity, tongue bite or frothing at the mouth.  He broke out into perspiration.  When the patient regained consciousness he was slightly disoriented for a minute or so but quickly recovered back to baseline and asked what happened.  He denied any headache, confusion or incontinence.  The patient had a CT scan of the head done on 05/18/2018 in the emergency room which was normal.  Subsequently had outpatient MRI scan of the brain done 05/29/2018 with and without contrast which was also normal.  He had outpatient EEG done on 07/04/2018 which was normal as well.  Patient denies any prior history of passing out, syncope or near syncopal symptoms.  He has no history of seizures, loss of consciousness, closed head injury, migraine headaches or recurrent concussions.  There is no family history of seizures or epilepsy.  He is otherwise healthy and  has no significant past medical history.  He is employed and has been able to return to his work without restrictions. ROS:   14 system review of systems is positive for snoring only and all other systems negative PMH: History reviewed. No pertinent past medical history.  Social History:  Social History   Socioeconomic History  . Marital status: Married    Spouse name: Not on file  . Number of children: Not on file  . Years of education: Not on file  . Highest education level: Not on file  Occupational History  . Not on file  Social Needs  . Financial resource strain: Not on file  . Food insecurity:    Worry: Not on file    Inability: Not on file  . Transportation needs:    Medical: Not on file    Non-medical: Not on file  Tobacco Use  . Smoking status: Never Smoker  . Smokeless tobacco: Never Used  Substance and Sexual Activity  . Alcohol use: No  . Drug use: No  . Sexual activity: Yes  Lifestyle  . Physical activity:    Days per week: Not on file    Minutes per session: Not on file  . Stress: Not on file  Relationships  . Social connections:    Talks on phone: Not on file    Gets together: Not on file    Attends religious service: Not on file    Active member of club or organization: Not  on file    Attends meetings of clubs or organizations: Not on file    Relationship status: Not on file  . Intimate partner violence:    Fear of current or ex partner: Not on file    Emotionally abused: Not on file    Physically abused: Not on file    Forced sexual activity: Not on file  Other Topics Concern  . Not on file  Social History Narrative  . Not on file    Medications:   Current Outpatient Medications on File Prior to Visit  Medication Sig Dispense Refill  . ibuprofen (ADVIL,MOTRIN) 600 MG tablet Take 1 tablet (600 mg total) by mouth every 8 (eight) hours as needed for moderate pain. 30 tablet 0  . Multiple Vitamin (MULTIVITAMIN WITH MINERALS) TABS Take 1 tablet  by mouth daily.     No current facility-administered medications on file prior to visit.     Allergies:   Allergies  Allergen Reactions  . Naproxen Nausea And Vomiting    Physical Exam General: well developed, well nourished young African-American male, seated, in no evident distress Head: head normocephalic and atraumatic.   Neck: supple with no carotid or supraclavicular bruits Cardiovascular: regular rate and rhythm, no murmurs Musculoskeletal: no deformity Skin:  no rash/petichiae Vascular:  Normal pulses all extremities  Neurologic Exam Mental Status: Awake and fully alert. Oriented to place and time. Recent and remote memory intact. Attention span, concentration and fund of knowledge appropriate. Mood and affect appropriate.  Cranial Nerves: Fundoscopic exam reveals sharp disc margins. Pupils equal, briskly reactive to light. Extraocular movements full without nystagmus. Visual fields full to confrontation. Hearing intact. Facial sensation intact. Face, tongue, palate moves normally and symmetrically.  Motor: Normal bulk and tone. Normal strength in all tested extremity muscles. Sensory.: intact to touch , pinprick , position and vibratory sensation.  Coordination: Rapid alternating movements normal in all extremities. Finger-to-nose and heel-to-shin performed accurately bilaterally. Gait and Station: Arises from chair without difficulty. Stance is normal. Gait demonstrates normal stride length and balance . Able to heel, toe and tandem walk without difficulty.  Reflexes: 1+ and symmetric. Toes downgoing.       ASSESSMENT: 33 year old African-American male with solitary episode of brief loss of consciousness in the setting of flu and dehydration likely a convulsive syncope rather than seizure.  Normal neurological exam, brain imaging and EEG studies    PLAN: I had a long discussion with the patient and I also spoke to his wife over the phone about his episode of brief loss  of consciousness which likely represents convulsive syncope in the setting of flulike illness due to dehydration rather than a tonic-clonic seizure.  He has normal brain imaging study and EEG.  I recommend adequate hydration.  He was advised not to drive for the next 3 months as per [facility].  Greater than 50% time during this 45-minute consultation visit were spent in counseling and coordination of care about syncope versus seizure and answering questions. He will return for follow-up in the future with my nurse practitioner Shanda Bumps or call earlier if necessary Delia Heady, MD  [facility] [address] [place], [place] [address]  Phone [phone] Fax [phone] Note: This document was prepared with digital dictation and possible smart phrase technology. Any transcriptional errors that result from this process are unintentional.

## 2018-11-10 ENCOUNTER — Telehealth: Payer: Self-pay

## 2018-11-10 NOTE — Telephone Encounter (Signed)
I called pt that visit will be r/s due to provider being out. I r/s pt to June appt. I receive verbal consent to do video and to file insurance. RN updated chart and meds, PCP, pharmacy. PTs phone carrier is [employer]. I explain the doxy process, and pt verbalized understanding.

## 2018-11-10 NOTE — Telephone Encounter (Signed)
PT made aware that is video due to COVID 19. He verbalized understanding.

## 2018-11-16 ENCOUNTER — Ambulatory Visit: Payer: Self-pay | Admitting: Adult Health

## 2018-11-22 ENCOUNTER — Other Ambulatory Visit: Payer: Self-pay

## 2018-11-22 ENCOUNTER — Ambulatory Visit (INDEPENDENT_AMBULATORY_CARE_PROVIDER_SITE_OTHER): Payer: Self-pay | Admitting: Adult Health

## 2018-11-22 DIAGNOSIS — Z0289 Encounter for other administrative examinations: Secondary | ICD-10-CM

## 2019-06-08 ENCOUNTER — Ambulatory Visit: Payer: 59 | Attending: Internal Medicine

## 2019-06-08 DIAGNOSIS — Z20822 Contact with and (suspected) exposure to covid-19: Secondary | ICD-10-CM

## 2019-06-09 LAB — NOVEL CORONAVIRUS, NAA: SARS-CoV-2, NAA: NOT DETECTED

## 2019-07-10 ENCOUNTER — Ambulatory Visit: Payer: 59 | Attending: Internal Medicine

## 2019-07-10 DIAGNOSIS — Z20822 Contact with and (suspected) exposure to covid-19: Secondary | ICD-10-CM

## 2019-07-11 LAB — NOVEL CORONAVIRUS, NAA: SARS-CoV-2, NAA: DETECTED — AB

## 2019-07-18 ENCOUNTER — Other Ambulatory Visit: Payer: 59

## 2019-07-19 ENCOUNTER — Ambulatory Visit: Payer: 59 | Attending: Internal Medicine

## 2019-07-19 DIAGNOSIS — Z20822 Contact with and (suspected) exposure to covid-19: Secondary | ICD-10-CM

## 2019-07-20 LAB — NOVEL CORONAVIRUS, NAA: SARS-CoV-2, NAA: DETECTED — AB

## 2019-08-04 ENCOUNTER — Ambulatory Visit: Payer: 59 | Attending: Internal Medicine

## 2019-08-04 DIAGNOSIS — Z20822 Contact with and (suspected) exposure to covid-19: Secondary | ICD-10-CM

## 2019-08-05 LAB — NOVEL CORONAVIRUS, NAA: SARS-CoV-2, NAA: NOT DETECTED

## 2020-02-13 ENCOUNTER — Other Ambulatory Visit: Payer: Self-pay

## 2020-02-13 ENCOUNTER — Encounter (HOSPITAL_COMMUNITY): Payer: Self-pay

## 2020-02-13 DIAGNOSIS — W230XXA Caught, crushed, jammed, or pinched between moving objects, initial encounter: Secondary | ICD-10-CM | POA: Insufficient documentation

## 2020-02-13 DIAGNOSIS — Y9367 Activity, basketball: Secondary | ICD-10-CM | POA: Insufficient documentation

## 2020-02-13 DIAGNOSIS — Y929 Unspecified place or not applicable: Secondary | ICD-10-CM | POA: Insufficient documentation

## 2020-02-13 DIAGNOSIS — Y999 Unspecified external cause status: Secondary | ICD-10-CM | POA: Diagnosis not present

## 2020-02-13 DIAGNOSIS — S62315A Displaced fracture of base of fourth metacarpal bone, left hand, initial encounter for closed fracture: Secondary | ICD-10-CM | POA: Diagnosis not present

## 2020-02-13 DIAGNOSIS — S6992XA Unspecified injury of left wrist, hand and finger(s), initial encounter: Secondary | ICD-10-CM | POA: Diagnosis present

## 2020-02-13 NOTE — ED Triage Notes (Signed)
Pt injured his left ring finger playing  basketball, he also has swelling noted to the hand

## 2020-02-14 ENCOUNTER — Emergency Department (HOSPITAL_COMMUNITY)
Admission: EM | Admit: 2020-02-14 | Discharge: 2020-02-14 | Disposition: A | Discharge status: Home / Self Care | Payer: 59 | Attending: Emergency Medicine | Admitting: Emergency Medicine

## 2020-02-14 ENCOUNTER — Emergency Department (HOSPITAL_COMMUNITY): Payer: 59

## 2020-02-14 DIAGNOSIS — S62315A Displaced fracture of base of fourth metacarpal bone, left hand, initial encounter for closed fracture: Secondary | ICD-10-CM

## 2020-02-14 MED ORDER — ACETAMINOPHEN 500 MG PO TABS: 1000.0000 mg | ORAL_TABLET | Freq: Three times a day (TID) | ORAL | 0 refills | Status: AC

## 2020-02-14 MED ORDER — HYDROCODONE-ACETAMINOPHEN 5-325 MG PO TABS: 1.0000 | ORAL_TABLET | Freq: Three times a day (TID) | ORAL | 0 refills | Status: AC | PRN

## 2020-02-14 MED ORDER — ACETAMINOPHEN 500 MG PO TABS
1000.0000 mg | ORAL_TABLET | Freq: Once | ORAL | Status: AC
  Administered 2020-02-14: 1000 mg via ORAL
  Filled 2020-02-14: qty 2

## 2020-02-14 NOTE — Progress Notes (Signed)
Orthopedic Tech Progress Note Patient Details:  Aaron Barber 1986/05/01 [id number] MD wanted extra splint material on anterior side of hand.  Ortho Devices Type of Ortho Device: Ulna gutter splint Ortho Device/Splint Location: URE Ortho Device/Splint Interventions: Application, Ordered   Post Interventions Patient Tolerated: Well Instructions Provided: Care of device   Aaron Barber A Aaron Barber 02/14/2020, 4:28 AM

## 2020-02-14 NOTE — ED Provider Notes (Signed)
[facility]-EMERGENCY DEPT Provider Note  CSN: [id number] Arrival date & time: 02/13/20 2236  Chief Complaint(s) No chief complaint on file.  HPI Aaron Barber is a 34 y.o. male   The history is provided by the patient.  Hand Injury Location:  Hand Hand location:  L hand Injury: yes   Time since incident:  9 hours Mechanism of injury comment:  Ring finger got caught in [place] and twisted while playing basketball Pain details:    Quality:  Aching   Severity:  Moderate   Onset quality:  Sudden   Timing:  Constant   Progression:  Waxing and waning Relieved by:  Acetaminophen Exacerbated by: palpation. Associated symptoms: no fatigue, no muscle weakness, no numbness, no stiffness, no swelling and no tingling     Past Medical History History reviewed. No pertinent past medical history. There are no problems to display for this patient.  Home Medication(s) Prior to Admission medications   Medication Sig Start Date End Date Taking? Authorizing Provider  acetaminophen (TYLENOL) 500 MG tablet Take 2 tablets (1,000 mg total) by mouth every 8 (eight) hours for 5 days. Do not take more than 4000 mg of acetaminophen (Tylenol) in a 24-hour period. Please note that other medicines that you may be prescribed may have Tylenol as well. 02/14/20 02/19/20  Nira Conn, MD  HYDROcodone-acetaminophen (NORCO/VICODIN) 5-325 MG tablet Take 1 tablet by mouth every 8 (eight) hours as needed for up to 5 days for severe pain (That is not improved by your scheduled acetaminophen regimen). Please do not exceed 4000 mg of acetaminophen (Tylenol) a 24-hour period. Please note that he may be prescribed additional medicine that contains acetaminophen. 02/14/20 02/19/20  Nira Conn, MD  ibuprofen (ADVIL,MOTRIN) 600 MG tablet Take 1 tablet (600 mg total) by mouth every 8 (eight) hours as needed for moderate pain. 06/01/16   Wallis Bamberg, PA-C  Multiple Vitamin (MULTIVITAMIN  WITH MINERALS) TABS Take 1 tablet by mouth daily.    [provider]                                                                                                                                    Past Surgical History History reviewed. No pertinent surgical history. Family History History reviewed. No pertinent family history.  Social History Social History   Tobacco Use  . Smoking status: Never Smoker  . Smokeless tobacco: Never Used  Substance Use Topics  . Alcohol use: No  . Drug use: No   Allergies Naproxen  Review of Systems Review of Systems  Constitutional: Negative for fatigue.  Musculoskeletal: Negative for stiffness.   All other systems are reviewed and are negative for acute change except as noted in the HPI  Physical Exam Vital Signs  I have reviewed the triage vital signs BP 129/79 (BP Location: Left Arm)   Pulse (!) 50   Temp 98 F (36.7 C) (Oral)  Resp 14   Ht 6\' 4"  (1.93 m)   Wt 87.9 kg   SpO2 100%   BMI 23.59 kg/m   Physical Exam Vitals reviewed.  Constitutional:      General: He is not in acute distress.    Appearance: He is well-developed. He is not diaphoretic.  HENT:     Head: Normocephalic and atraumatic.     Jaw: No trismus.     Right Ear: External ear normal.     Left Ear: External ear normal.     Nose: Nose normal.  Eyes:     General: No scleral icterus.    Conjunctiva/sclera: Conjunctivae normal.  Neck:     Trachea: Phonation normal.  Cardiovascular:     Rate and Rhythm: Normal rate and regular rhythm.  Pulmonary:     Effort: Pulmonary effort is normal. No respiratory distress.     Breath sounds: No stridor.  Abdominal:     General: There is no distension.  Musculoskeletal:        General: Normal range of motion.     Left hand: Swelling, deformity and tenderness present. Normal strength. Normal sensation. Normal capillary refill. Normal pulse.       Hands:     Cervical back: Normal range of motion.    Neurological:     Mental Status: He is alert and oriented to person, place, and time.  Psychiatric:        Behavior: Behavior normal.     ED Results and Treatments Labs (all labs ordered are listed, but only abnormal results are displayed) Labs Reviewed - No data to display                                                                                                                       EKG  EKG Interpretation  Date/Time:    Ventricular Rate:    PR Interval:    QRS Duration:   QT Interval:    QTC Calculation:   R Axis:     Text Interpretation:        Radiology DG Hand Complete Left  Result Date: 02/14/2020 CLINICAL DATA:  34 year old male with trauma to the left hand. EXAM: LEFT HAND - COMPLETE 3+ VIEW COMPARISON:  None. FINDINGS: There is a mildly displaced oblique fracture of the proximal portion of the fourth metacarpal. No other acute fracture. There is no dislocation. The bones are well mineralized. No arthritic changes. The soft tissues are unremarkable. IMPRESSION: Mildly displaced oblique fracture of the fourth metacarpal. Electronically Signed   By: 20 M.D.   On: 02/14/2020 00:49    Pertinent labs & imaging results that were available during my care of the patient were reviewed by me and considered in my medical decision making (see chart for details).  Medications Ordered in ED Medications  acetaminophen (TYLENOL) tablet 1,000 mg (1,000 mg Oral Given 02/14/20 0314)  Procedures .Splint Application  Date/Time: 02/14/2020 3:31 AM Performed by: Nira Conn, MD Authorized by: Nira Conn, MD   Consent:    Consent obtained:  Verbal   Consent given by:  Patient   Risks discussed:  Pain, swelling, numbness and discoloration   Alternatives discussed:  Alternative treatment Pre-procedure details:     Sensation:  Normal Procedure details:    Laterality:  Left   Location:  Hand   Hand:  L hand   Splint type:  Volar short arm and ulnar gutter   Supplies:  Ortho-Glass Post-procedure details:    Pain:  Improved   Sensation:  Normal   Patient tolerance of procedure:  Tolerated well, no immediate complications    (including critical care time)  Medical Decision Making / ED Course I have reviewed the nursing notes for this encounter and the patient's prior records (if available in EHR or on provided paperwork).   Aaron Barber was evaluated in Emergency Department on 02/14/2020 for the symptoms described in the history of present illness. He was evaluated in the context of the global COVID-19 pandemic, which necessitated consideration that the patient might be at risk for infection with the SARS-CoV-2 virus that causes COVID-19. Institutional protocols and algorithms that pertain to the evaluation of patients at risk for COVID-19 are in a state of rapid change based on information released by regulatory bodies including the CDC and federal and state organizations. These policies and algorithms were followed during the patient's care in the ED.  Plain film notable for midshaft fracture of the 4th metacarpal. Splinted. Hand surgery follow up for definitative management.      Final Clinical Impression(s) / ED Diagnoses Final diagnoses:  Displaced fracture of base of fourth metacarpal bone, left hand, initial encounter for closed fracture   The patient appears reasonably screened and/or stabilized for discharge and I doubt any other medical condition or other [facility] requiring further screening, evaluation, or treatment in the ED at this time prior to discharge. Safe for discharge with strict return precautions.  Disposition: Discharge  Condition: Good  I have discussed the results, Dx and Tx plan with the patient/family who expressed understanding and agree(s) with the plan. Discharge  instructions discussed at length. The patient/family was given strict return precautions who verbalized understanding of the instructions. No further questions at time of discharge.    ED Discharge Orders         Ordered    acetaminophen (TYLENOL) 500 MG tablet  Every 8 hours        02/14/20 0331    HYDROcodone-acetaminophen (NORCO/VICODIN) 5-325 MG tablet  Every 8 hours PRN        02/14/20 0331          [facility] narcotic database reviewed and no active prescriptions noted.   Follow Up: Wilfrid Lund, PA [address] [place] [place] [address] [phone]  Schedule an appointment as soon as possible for a visit  As needed  Dominica Severin, MD [address] [place] [address] [phone]  Schedule an appointment as soon as possible for a visit  For close follow up to assess for hand fracture      This chart was dictated using voice recognition software.  Despite best efforts to proofread,  errors can occur which can change the documentation meaning.   Nira Conn, MD 02/14/20 [phone]

## 2020-09-26 ENCOUNTER — Emergency Department (HOSPITAL_COMMUNITY): Payer: 59

## 2020-09-26 ENCOUNTER — Emergency Department (HOSPITAL_COMMUNITY)
Admission: EM | Admit: 2020-09-26 | Discharge: 2020-09-26 | Disposition: A | Discharge status: Home / Self Care | Payer: 59 | Attending: Emergency Medicine | Admitting: Emergency Medicine

## 2020-09-26 DIAGNOSIS — Y92310 Basketball court as the place of occurrence of the external cause: Secondary | ICD-10-CM | POA: Diagnosis not present

## 2020-09-26 DIAGNOSIS — Y9367 Activity, basketball: Secondary | ICD-10-CM | POA: Insufficient documentation

## 2020-09-26 DIAGNOSIS — S62324A Displaced fracture of shaft of fourth metacarpal bone, right hand, initial encounter for closed fracture: Secondary | ICD-10-CM | POA: Diagnosis not present

## 2020-09-26 DIAGNOSIS — S6991XA Unspecified injury of right wrist, hand and finger(s), initial encounter: Secondary | ICD-10-CM | POA: Diagnosis present

## 2020-09-26 DIAGNOSIS — W2105XA Struck by basketball, initial encounter: Secondary | ICD-10-CM | POA: Insufficient documentation

## 2020-09-26 MED ORDER — ACETAMINOPHEN 325 MG PO TABS
650.0000 mg | ORAL_TABLET | Freq: Once | ORAL | Status: AC
  Administered 2020-09-26: 650 mg via ORAL
  Filled 2020-09-26: qty 2

## 2020-09-26 NOTE — ED Provider Notes (Signed)
MSE was initiated and I personally evaluated the patient and placed orders (if any) at  7:56 PM on September 26, 2020.  The patient appears stable so that the remainder of the MSE may be completed by another provider.   Anselm Pancoast, PA-C 09/26/20 1957    Sabas Sous, MD 09/27/20 [phone]

## 2020-09-26 NOTE — ED Provider Notes (Addendum)
[facility] EMERGENCY DEPARTMENT Provider Note   CSN: [id number] Arrival date & time: 09/26/20  1925     History Chief Complaint  Patient presents with  . Hand Pain    Aaron Barber is a 35 y.o. male.  HPI 35 year old male with pain to his right hand after playing basketball.  He has pain to the dorsum of his right hand.  Denies any numbness or tingling.  Felt like he had heard a "snap".  Has pain with gripping    No past medical history on file.  There are no problems to display for this patient.   No past surgical history on file.     No family history on file.  Social History   Tobacco Use  . Smoking status: Never Smoker  . Smokeless tobacco: Never Used  Substance Use Topics  . Alcohol use: No  . Drug use: No    Home Medications Prior to Admission medications   Medication Sig Start Date End Date Taking? Authorizing Provider  ibuprofen (ADVIL,MOTRIN) 600 MG tablet Take 1 tablet (600 mg total) by mouth every 8 (eight) hours as needed for moderate pain. 06/01/16   Wallis Bamberg, PA-C  Multiple Vitamin (MULTIVITAMIN WITH MINERALS) TABS Take 1 tablet by mouth daily.    [provider]    Allergies    Naproxen  Review of Systems   Review of Systems  Musculoskeletal: Positive for arthralgias and joint swelling.  Skin: Negative for color change and wound.    Physical Exam Updated Vital Signs BP 137/80 (BP Location: Right Arm)   Pulse 75   Temp 98.7 F (37.1 C) (Oral)   Resp 16   SpO2 97%   Physical Exam Vitals and nursing note reviewed.  Constitutional:      Appearance: He is well-developed.  HENT:     Head: Normocephalic and atraumatic.  Eyes:     Conjunctiva/sclera: Conjunctivae normal.  Cardiovascular:     Rate and Rhythm: Normal rate and regular rhythm.     Heart sounds: No murmur heard.   Pulmonary:     Effort: Pulmonary effort is normal. No respiratory distress.     Breath sounds: Normal breath sounds.   Abdominal:     Palpations: Abdomen is soft.     Tenderness: There is no abdominal tenderness.  Musculoskeletal:        General: Swelling and tenderness present. Normal range of motion.     Cervical back: Neck supple.     Right lower leg: No edema.     Left lower leg: No edema.     Comments: Right hand with mild to moderate swelling over the dorsum of the hand.  No visible skin breakage.  Moving all 5 digits without difficulty.  Pain to palpation over the fourth metacarpal and right index finger knuckle.  Neurovascularly intact. Slightly decreased grip strength secondary to pain   Skin:    General: Skin is warm and dry.     Findings: No erythema.  Neurological:     General: No focal deficit present.     Mental Status: He is alert and oriented to person, place, and time.  Psychiatric:        Mood and Affect: Mood normal.        Behavior: Behavior normal.     ED Results / Procedures / Treatments   Labs (all labs ordered are listed, but only abnormal results are displayed) Labs Reviewed - No data to display  EKG None  Radiology DG Wrist Complete Right  Result Date: 09/26/2020 CLINICAL DATA:  Right hand and wrist pain after injury. Injury playing basketball and heard a pop. Swelling. EXAM: RIGHT WRIST - COMPLETE 3+ VIEW COMPARISON:  None. FINDINGS: Mildly displaced and angulated fourth metacarpal shaft fracture. There is no additional fracture of the wrist. Normal radiocarpal and carpal bone alignment. No wrist focal soft tissue abnormality. IMPRESSION: Mildly displaced and angulated fourth metacarpal shaft fracture. No additional fracture of the wrist. Electronically Signed   By: Narda Rutherford M.D.   On: 09/26/2020 20:20   DG Hand Complete Right  Result Date: 09/26/2020 CLINICAL DATA: Right hand and wrist pain after injury. Injury playing basketball and heard a pop. EXAM: RIGHT HAND - COMPLETE 3+ VIEW COMPARISON:  None. FINDINGS: Oblique fourth metacarpal shaft fracture is mildly  displaced and angulated. No intra-articular extension. No additional fracture of the hand. Normal alignment and joint spaces. There is mild soft tissue edema at the fracture site. IMPRESSION: Oblique mildly displaced and angulated fourth metacarpal shaft fracture. Electronically Signed   By: Narda Rutherford M.D.   On: 09/26/2020 20:19    Procedures Procedures   Medications Ordered in ED Medications  acetaminophen (TYLENOL) tablet 650 mg (650 mg Oral Given 09/26/20 2044)    ED Course  I have reviewed the triage vital signs and the nursing notes.  Pertinent labs & imaging results that were available during my care of the patient were reviewed by me and considered in my medical decision making (see chart for details).    MDM Rules/Calculators/A&P                          35 year old male with a mildly displaced and angulated fourth metacarpal shaft fracture.  No evidence of open fracture.  Neurovascularly intact.  Patient was placed in an volar splint as per discussion with Dr. Rosalia Hammers.  Will refer to hand surgery.  Return precautions discussed.  Encouraged Tylenol ibuprofen for pain.  He was understanding scribbled.  Stable for discharge.   Final Clinical Impression(s) / ED Diagnoses Final diagnoses:  Closed displaced fracture of shaft of fourth metacarpal bone of right hand, initial encounter    Rx / DC Orders ED Discharge Orders    None         Leone Brand 09/26/20 2245    Margarita Grizzle, MD 09/30/20 1415

## 2020-09-26 NOTE — Discharge Instructions (Addendum)
Please make sure to follow-up with hand surgery within the next few days.  Call the number tomorrow to schedule an appointment.  Please take Tylenol or ibuprofen for pain.  Return to the ER for any new or worsening symptoms.

## 2020-09-26 NOTE — ED Triage Notes (Signed)
Pt reports was playing basketball and injured his right hand and heard it "snap"

## 2020-09-26 NOTE — Progress Notes (Signed)
Orthopedic Tech Progress Note Patient Details:  Ronne Stefanski May 16, 1986 [id number]  Ortho Devices Type of Ortho Device: Volar splint Ortho Device/Splint Location: RUE Ortho Device/Splint Interventions: Ordered,Application   Post Interventions Patient Tolerated: Well Instructions Provided: Adjustment of device,Care of device   Cydne Grahn 09/26/2020, 10:50 PM

## 2021-03-17 ENCOUNTER — Inpatient Hospital Stay: Payer: 59 | Attending: Genetic Counselor | Admitting: Licensed Clinical Social Worker

## 2021-03-17 ENCOUNTER — Other Ambulatory Visit: Payer: Self-pay

## 2021-03-17 ENCOUNTER — Encounter: Payer: Self-pay | Admitting: Licensed Clinical Social Worker

## 2021-03-17 ENCOUNTER — Inpatient Hospital Stay: Payer: 59

## 2021-03-17 DIAGNOSIS — Z8481 Family history of carrier of genetic disease: Secondary | ICD-10-CM | POA: Insufficient documentation

## 2021-03-17 DIAGNOSIS — Z803 Family history of malignant neoplasm of breast: Secondary | ICD-10-CM

## 2021-03-17 LAB — GENETIC SCREENING ORDER

## 2021-03-17 NOTE — Progress Notes (Signed)
REFERRING PROVIDER: Self-referred  PRIMARY PROVIDER:  Lois Huxley, PA  PRIMARY REASON FOR VISIT:  1. Family history of breast cancer   2. Family history of BRCA2 gene positive      HISTORY OF PRESENT ILLNESS:   Aaron Barber, a 35 y.o. male, was seen for a [facility] cancer genetics consultation due to a family history of breast cancer and his sister's recent testing that revealed a BRCA2 mutation.  Aaron Barber presents to clinic today to discuss the possibility of a hereditary predisposition to cancer, genetic testing, and to further clarify his future cancer risks, as well as potential cancer risks for family members.   Aaron Barber is a 35 y.o. male with no personal history of cancer.    CANCER HISTORY:  Oncology History   No history exists.   Past Medical History:  Diagnosis Date   Family history of BRCA2 gene positive    Family history of breast cancer      Social History   Socioeconomic History   Marital status: Married    Spouse name: Not on file   Number of children: Not on file   Years of education: Not on file   Highest education level: Not on file  Occupational History   Not on file  Tobacco Use   Smoking status: Never   Smokeless tobacco: Never  Substance and Sexual Activity   Alcohol use: No   Drug use: No   Sexual activity: Yes  Other Topics Concern   Not on file  Social History Narrative   Not on file   Social Determinants of Health   Financial Resource Strain: Not on file  Food Insecurity: Not on file  Transportation Needs: Not on file  Physical Activity: Not on file  Stress: Not on file  Social Connections: Not on file     FAMILY HISTORY:  We obtained a detailed, 4-generation family history.  Significant diagnoses are listed below: Family History  Problem Relation Age of Onset   Breast cancer Mother 47   Breast cancer Sister 41       BRCA2+   Breast cancer Maternal Grandmother    Aaron Barber has a daughter (3) and a son (1). Neither have  had cancer. He has a sister (16) who was diagnosed with breast cancer recently and also tested positive for a BRCA2 mutation. Patient plans to send me a copy of the report. Patient also has a brother (66) with no cancer history.  Aaron Barber mother had breast cancer at 77 and is living at 83. She is an only child. Maternal grandmother had breast cancer and died at 93. Her mother also had breast cancer, and her niece had a GYN cancer. Patient's maternal grandfather died at 46 of a heart attack.  Aaron Barber father is living at 75, no cancer history. Patient has 2 paternal aunts, no cancers for either of them or for their children. Paternal grandmother died at 52 of Alzheimer's and grandfather died at 11.  Aaron Barber is aware of previous family history of genetic testing for hereditary cancer risks.  There is no reported Ashkenazi Jewish ancestry. There is no known consanguinity.    GENETIC COUNSELING ASSESSMENT: Aaron Barber is a 35 y.o. male with a family history of a BRCA2 mutation. We, therefore, discussed and recommended the following at today's visit.   DISCUSSION: We discussed that approximately 5-10% of  cancer is hereditary. We discussed the BRCA2 gene in detail, noting cancer risks and potential management  changes. He has a 50% chance to have inherited the same mutation found in his sister. There are other genes associated with hereditary  cancer as well that we can test if he is interested. Cancers and risks are gene specific.  We discussed that testing is beneficial for several reasons including  knowing about other cancer risks, identifying potential screening and risk-reduction options that may be appropriate, and to understand if other family members could be at risk for cancer and allow them to undergo genetic testing.   We reviewed the characteristics, features and inheritance patterns of hereditary cancer syndromes. We also discussed genetic testing, including the appropriate family members  to test, the process of testing, insurance coverage and turn-around-time for results. We discussed the implications of a negative, positive and/or variant of uncertain significant result. We recommended Aaron Barber pursue genetic testing for the [facility] CancerNext-Expanded+RNA gene panel.   The CancerNext-Expanded + RNAinsight gene panel offered by [employer] and includes sequencing and rearrangement analysis for the following 77 genes: IP, ALK, APC*, ATM*, AXIN2, BAP1, BARD1, BLM, BMPR1A, BRCA1*, BRCA2*, BRIP1*, CDC73, CDH1*,CDK4, CDKN1B, CDKN2A, CHEK2*, CTNNA1, DICER1, FANCC, FH, FLCN, GALNT12, KIF1B, LZTR1, MAX, MEN1, MET, MLH1*, MSH2*, MSH3, MSH6*, MUTYH*, NBN, NF1*, NF2, NTHL1, PALB2*, PHOX2B, PMS2*, POT1, PRKAR1A, PTCH1, PTEN*, RAD51C*, RAD51D*,RB1, RECQL, RET, SDHA, SDHAF2, SDHB, SDHC, SDHD, SMAD4, SMARCA4, SMARCB1, SMARCE1, STK11, SUFU, TMEM127, TP53*,TSC1, TSC2, VHL and XRCC2 (sequencing and deletion/duplication); EGFR, EGLN1, HOXB13, KIT, MITF, PDGFRA, POLD1 and POLE (sequencing only); EPCAM and GREM1 (deletion/duplication only).  Based on Aaron Barber family history of cancer, he meets medical criteria for genetic testing. Despite that he meets criteria, he may still have an out of pocket cost. We discussed that if his out of pocket cost for testing is over $100, the laboratory will call and confirm whether he wants to proceed with testing.  If the out of pocket cost of testing is less than $100 he will be billed by the genetic testing laboratory.   We discussed that some people do not want to undergo genetic testing due to fear of genetic discrimination.  A federal law called the Genetic Information Non-Discrimination Act (GINA) of 2008 helps protect individuals against genetic discrimination based on their genetic test results.  It impacts both health insurance and employment.  For health insurance, it protects against increased premiums, being kicked off insurance or being forced to take a test in  order to be insured.  For employment it protects against hiring, firing and promoting decisions based on genetic test results.  Health status due to a cancer diagnosis is not protected under GINA.  This law does not protect life insurance, disability insurance, or other types of insurance.   PLAN: After considering the risks, benefits, and limitations, Aaron Barber provided informed consent to pursue genetic testing and the blood sample was sent to [facility] for analysis of the CancerNext-Expanded+RNA. Results should be available within approximately 2-3 weeks' time, at which point they will be disclosed by telephone to Aaron Barber, as will any additional recommendations warranted by these results. Aaron Barber will receive a summary of his genetic counseling visit and a copy of his results once available. This information will also be available in Epic.   Aaron Barber questions were answered to his satisfaction today. Our contact information was provided should additional questions or concerns arise. Thank you for the referral and allowing [place] to share in the care of your patient.   Faith Rogue, MS, [facility] Genetic Counselor [email or web address] Phone: [phone]  The patient was seen for a total of 40 minutes in face-to-face genetic counseling.  Patient was seen with his wife, Aaron Barber. Dr. Grayland Ormond was available for discussion regarding this case.   _______________________________________________________________________ For Office Staff:  Number of people involved in session: 2 Was an Intern/ student involved with case: no

## 2021-04-01 ENCOUNTER — Telehealth: Payer: Self-pay | Admitting: Licensed Clinical Social Worker

## 2021-04-01 ENCOUNTER — Ambulatory Visit: Payer: Self-pay | Admitting: Licensed Clinical Social Worker

## 2021-04-01 ENCOUNTER — Encounter: Payer: Self-pay | Admitting: Licensed Clinical Social Worker

## 2021-04-01 DIAGNOSIS — Z1379 Encounter for other screening for genetic and chromosomal anomalies: Secondary | ICD-10-CM | POA: Insufficient documentation

## 2021-04-01 DIAGNOSIS — Z803 Family history of malignant neoplasm of breast: Secondary | ICD-10-CM

## 2021-04-01 DIAGNOSIS — Z8481 Family history of carrier of genetic disease: Secondary | ICD-10-CM

## 2021-04-01 NOTE — Telephone Encounter (Signed)
Revealed negative genetic testing.  This normal result is reassuring.  It is unlikely that there is an increased risk of  cancer due to a mutation in one of these genes.  However, genetic testing is not perfect, and cannot definitively rule out a hereditary cause.  It will be important for him to keep in contact with genetics to learn if any additional testing may be needed in the future.      

## 2021-04-01 NOTE — Progress Notes (Signed)
HPI:  Mr. Watlington was previously seen in the [place] clinic due to a family history of cancer and his sister's recent genetic testing that revealed a BRCA2 mutation (report not available for review) and concerns regarding a hereditary predisposition to cancer. Please refer to our prior cancer genetics clinic note for more information regarding our discussion, assessment and recommendations, at the time. Mr. Schueller recent genetic test results were disclosed to him, as were recommendations warranted by these results. These results and recommendations are discussed in more detail below.  CANCER HISTORY:  Oncology History   No history exists.    FAMILY HISTORY:  We obtained a detailed, 4-generation family history.  Significant diagnoses are listed below: Family History  Problem Relation Age of Onset   Breast cancer Mother 61   Breast cancer Sister 71       BRCA2+   Breast cancer Maternal Grandmother     Mr. Thayer has a daughter (3) and a son (1). Neither have had cancer. He has a sister (34) who was diagnosed with breast cancer recently and also tested positive for a BRCA2 mutation. Patient plans to send me a copy of the report. Patient also has a brother (7) with no cancer history.   Mr. Mano mother had breast cancer at 19 and is living at 61. She is an only child. Maternal grandmother had breast cancer and died at 29. Her mother also had breast cancer, and her niece had a GYN cancer. Patient's maternal grandfather died at 12 of a heart attack.   Mr. Meroney father is living at 35, no cancer history. Patient has 2 paternal aunts, no cancers for either of them or for their children. Paternal grandmother died at 63 of Alzheimer's and grandfather died at 35.   Mr. Basquez is aware of previous family history of genetic testing for hereditary cancer risks.  There is no reported Ashkenazi Jewish ancestry. There is no known consanguinity.     GENETIC TEST RESULTS: Genetic  testing reported out on 03/27/2021 through the [facility] CancerNext-Expanded+RNA cancer panel found no pathogenic mutations.   The CancerNext-Expanded + RNAinsight gene panel offered by [employer] and includes sequencing and rearrangement analysis for the following 77 genes: IP, ALK, APC*, ATM*, AXIN2, BAP1, BARD1, BLM, BMPR1A, BRCA1*, BRCA2*, BRIP1*, CDC73, CDH1*,CDK4, CDKN1B, CDKN2A, CHEK2*, CTNNA1, DICER1, FANCC, FH, FLCN, GALNT12, KIF1B, LZTR1, MAX, MEN1, MET, MLH1*, MSH2*, MSH3, MSH6*, MUTYH*, NBN, NF1*, NF2, NTHL1, PALB2*, PHOX2B, PMS2*, POT1, PRKAR1A, PTCH1, PTEN*, RAD51C*, RAD51D*,RB1, RECQL, RET, SDHA, SDHAF2, SDHB, SDHC, SDHD, SMAD4, SMARCA4, SMARCB1, SMARCE1, STK11, SUFU, TMEM127, TP53*,TSC1, TSC2, VHL and XRCC2 (sequencing and deletion/duplication); EGFR, EGLN1, HOXB13, KIT, MITF, PDGFRA, POLD1 and POLE (sequencing only); EPCAM and GREM1 (deletion/duplication only).   The test report has been scanned into EPIC and is located under the Molecular Pathology section of the Results Review tab.  A portion of the result report is included below for reference.     We discussed that because current genetic testing is not perfect, it is possible there may be a gene mutation in one of these genes that current testing cannot detect, but that chance is small.  There could be another gene that has not yet been discovered, or that we have not yet tested, that is responsible for the cancer diagnoses in the family. It is also possible there is a hereditary cause for the cancer in the family that Mr. Letizia did not inherit and therefore was not identified in his testing.  Therefore, it  is important to remain in touch with cancer genetics in the future so that we can continue to offer Mr. Fearnow the most up to date genetic testing.   ADDITIONAL GENETIC TESTING: We discussed with Mr. Sult that his genetic testing was fairly extensive.  If there are genes identified to increase cancer risk that can be analyzed in the  future, we would be happy to discuss and coordinate this testing at that time.    CANCER SCREENING RECOMMENDATIONS: Mr. Kornegay test result is considered negative (normal). We did not identify any pathogenic variants in BRCA2 or in any of the other genes we looked at. We did not have a copy of his sister's BRCA2 report at the time of testing.  While reassuring, this does not definitively rule out a hereditary predisposition to cancer. It is still possible that there could be genetic mutations that are undetectable by current technology. There could be genetic mutations in genes that have not been tested or identified to increase cancer risk.  Therefore, it is recommended he continue to follow the cancer management and screening guidelines provided by his primary healthcare provider.   An individual's cancer risk and medical management are not determined by genetic test results alone. Overall cancer risk assessment incorporates additional factors, including personal medical history, family history, and any available genetic information that may result in a personalized plan for cancer prevention and surveillance.   RECOMMENDATIONS FOR FAMILY MEMBERS:  Relatives in this family might be at some increased risk of developing cancer, over the general population risk, simply due to the family history of cancer.  We recommended male relatives in this family have a yearly mammogram beginning at age 70, or 26 years younger than the earliest onset of cancer, an annual clinical breast exam, and perform monthly breast self-exams. Male relatives in this family should also have a gynecological exam as recommended by their primary provider.  All family members should be referred for colonoscopy starting at age 55.   Based on Mr. Cutshaw family history, we recommended those related to his sister have genetic counseling and testing. Mr. Kolander will let us know if we can be of any assistance in coordinating genetic  counseling and/or testing for these family members.  FOLLOW-UP: Lastly, we discussed with Mr. Bazen that cancer genetics is a rapidly advancing field and it is possible that new genetic tests will be appropriate for him and/or his family members in the future. We encouraged him to remain in contact with cancer genetics on an annual basis so we can update his personal and family histories and let him know of advances in cancer genetics that may benefit this family.   Our contact number was provided. Mr. Formby questions were answered to his satisfaction, and he knows he is welcome to call us at anytime with additional questions or concerns.   Faith Rogue, MS, [facility] Genetic Counselor [email or web address] Phone: [phone]

## 2022-12-11 IMAGING — DX DG WRIST COMPLETE 3+V*R*
4 series · 4 of 4 positions shown · non-contrast
Comparison: None.

CLINICAL DATA: Right hand and wrist pain after injury. Injury
playing basketball and heard a pop. Swelling.

EXAM:
RIGHT WRIST - COMPLETE 3+ VIEW

[wrist pa]
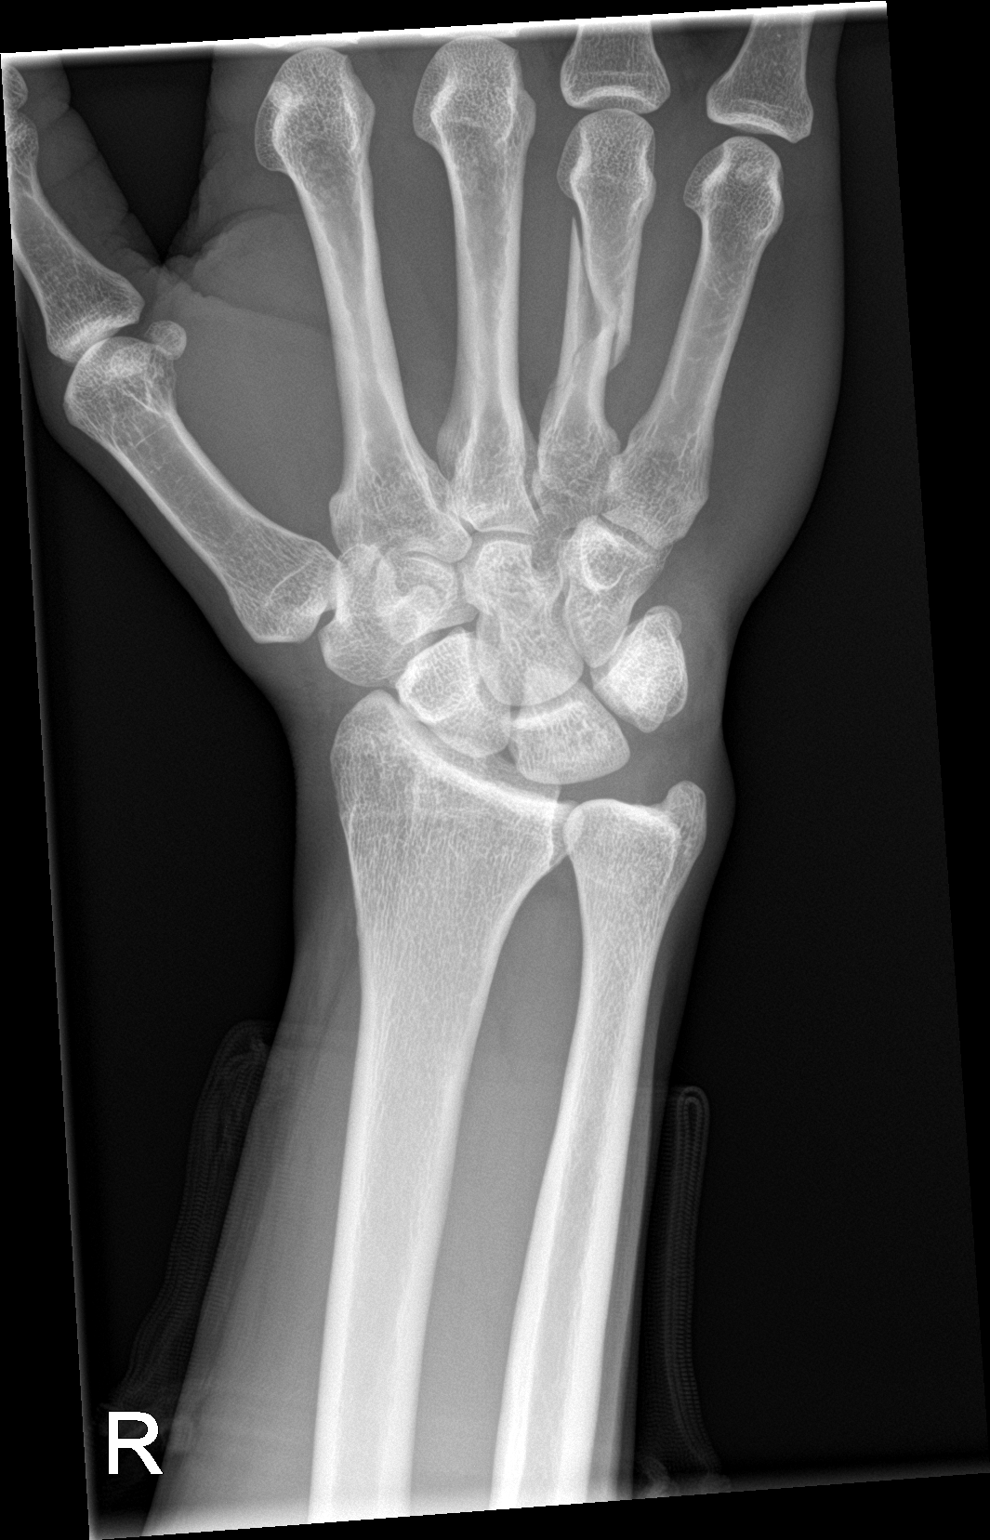

[wrist obl]
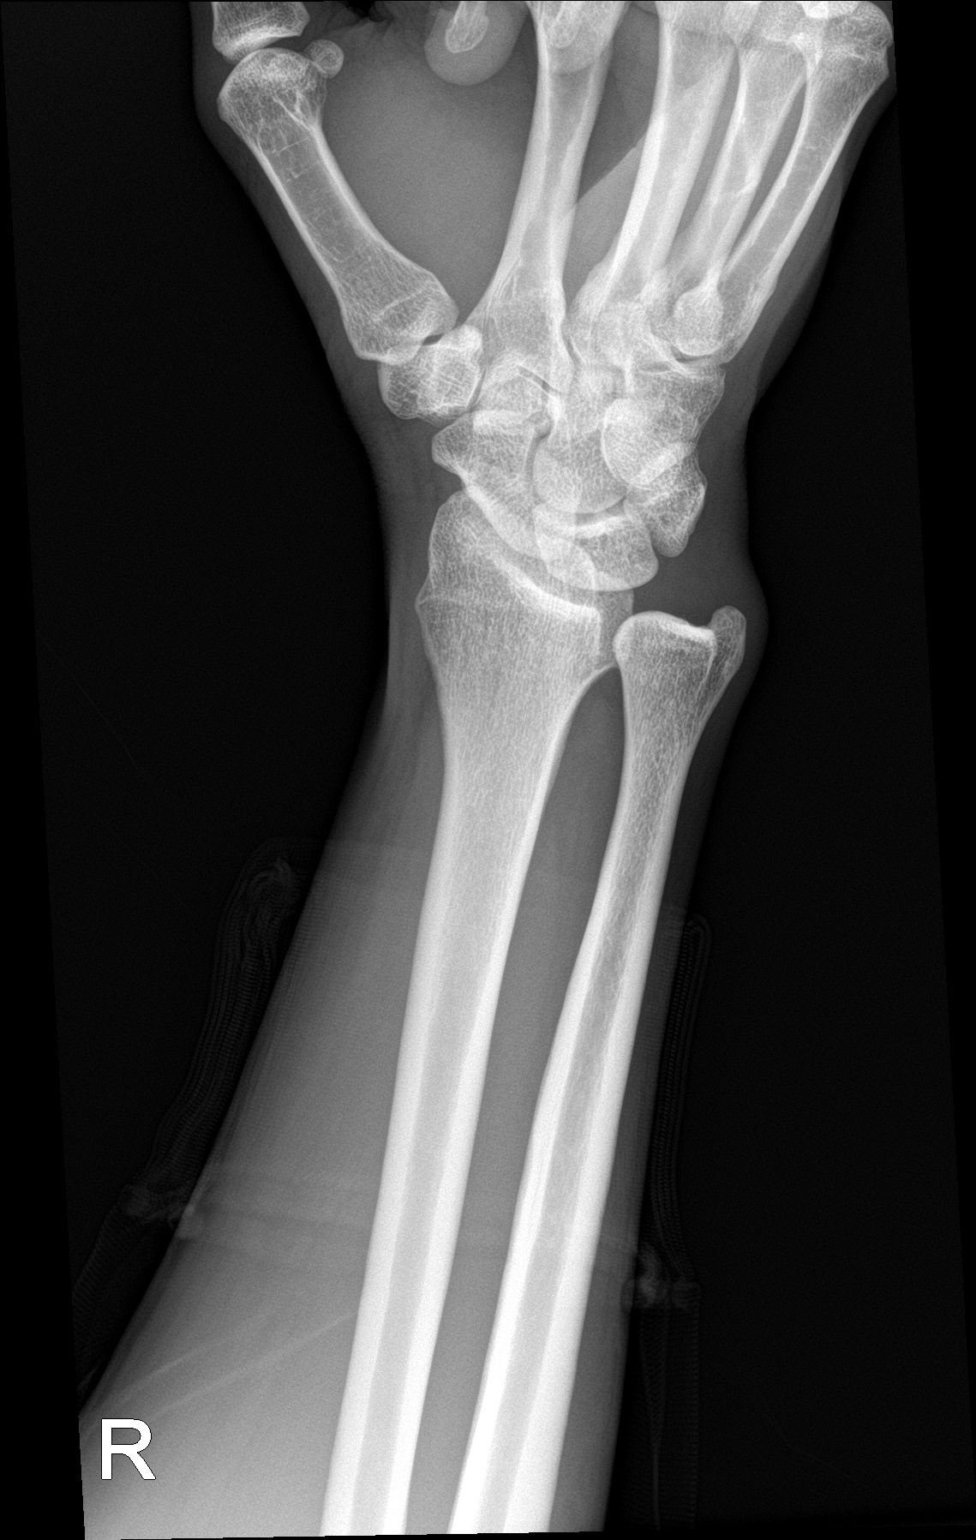

[wrist lat]
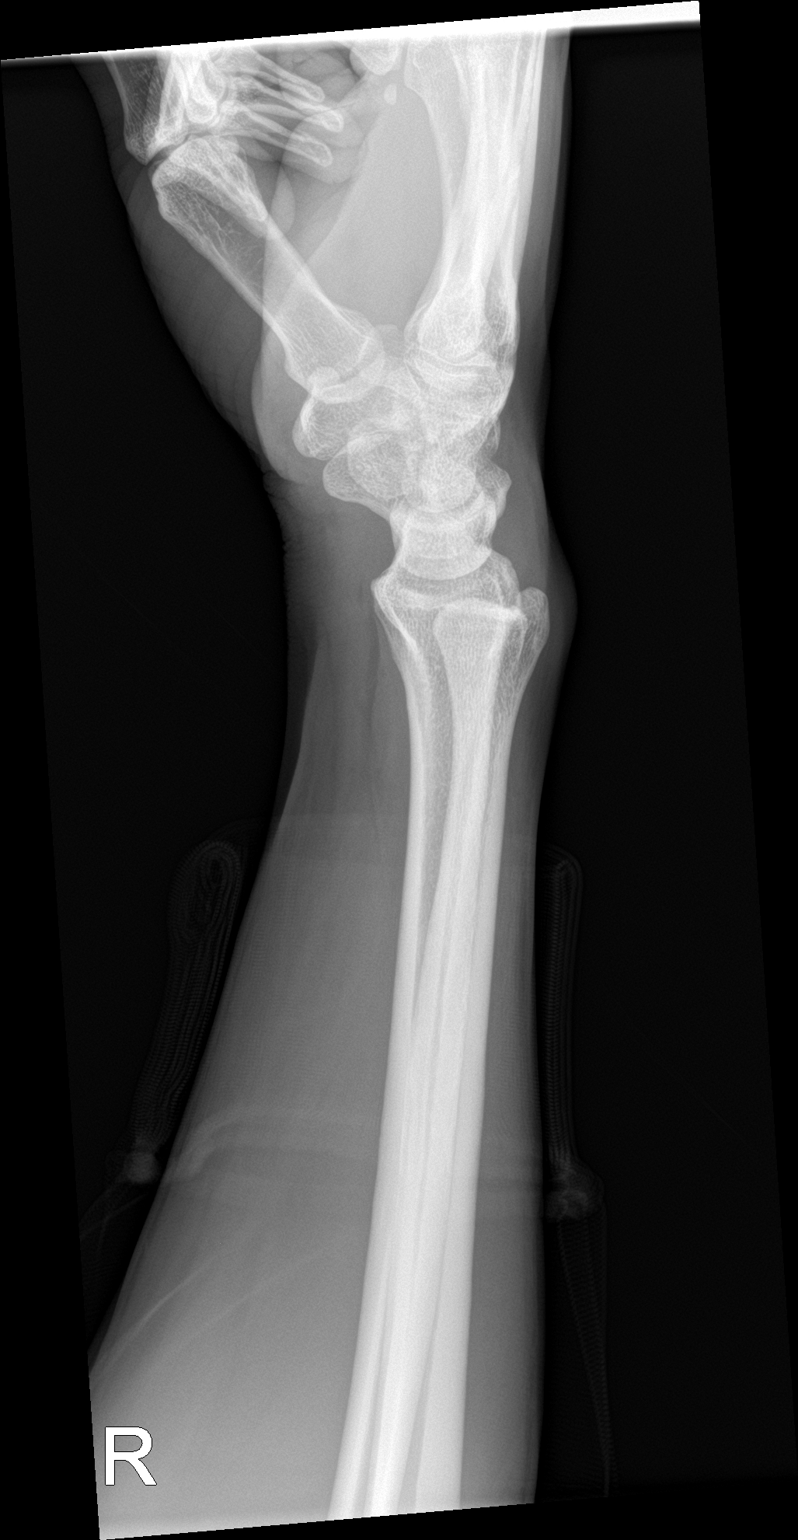

[wrist navicular]
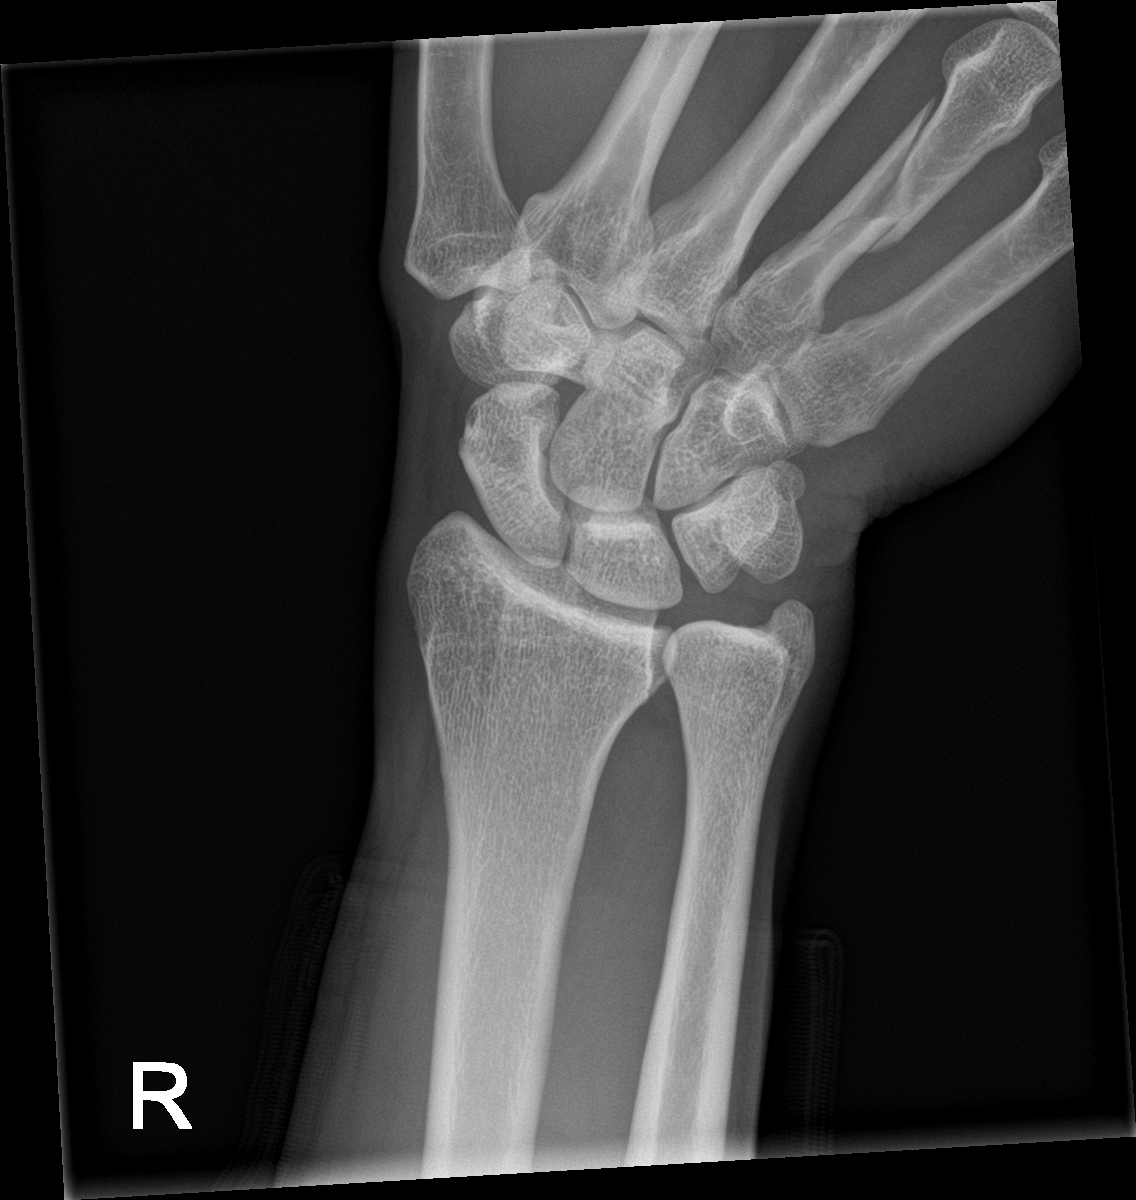

[4 of 4 positions shown; findings below may reference images not displayed]

FINDINGS: Mildly displaced and angulated fourth metacarpal shaft fracture.
There is no additional fracture of the wrist. Normal radiocarpal and
carpal bone alignment. No wrist focal soft tissue abnormality.
IMPRESSION: Mildly displaced and angulated fourth metacarpal shaft fracture. No
additional fracture of the wrist.

## 2023-07-14 ENCOUNTER — Ambulatory Visit: Payer: 59 | Admitting: Allergy
